# Patient Record
Sex: Female | Born: 1950
Health system: Southern US, Community
[De-identification: ages and names within clinical notes are randomized; demographics above are authoritative.]

## PROBLEM LIST (undated history)

## (undated) DIAGNOSIS — Q279 Congenital malformation of peripheral vascular system, unspecified: Secondary | ICD-10-CM

## (undated) DIAGNOSIS — Z862 Personal history of diseases of the blood and blood-forming organs and certain disorders involving the immune mechanism: Secondary | ICD-10-CM

## (undated) DIAGNOSIS — Z87898 Personal history of other specified conditions: Secondary | ICD-10-CM

## (undated) DIAGNOSIS — Z8719 Personal history of other diseases of the digestive system: Secondary | ICD-10-CM

## (undated) DIAGNOSIS — G252 Other specified forms of tremor: Secondary | ICD-10-CM

## (undated) HISTORY — PX: OTHER SURGICAL HISTORY: SHX169

## (undated) HISTORY — DX: Personal history of other diseases of the digestive system: Z87.19

## (undated) HISTORY — DX: Personal history of other specified conditions: Z87.898

## (undated) HISTORY — DX: Personal history of diseases of the blood and blood-forming organs and certain disorders involving the immune mechanism: Z86.2

## (undated) HISTORY — PX: CARDIOVASCULAR STRESS TEST: SHX262

## (undated) HISTORY — PX: ESOPHAGOGASTRODUODENOSCOPY: SHX1529

## (undated) HISTORY — DX: Other specified forms of tremor: G25.2

## (undated) HISTORY — DX: Congenital malformation of peripheral vascular system, unspecified: Q27.9

---

## 1998-07-06 ENCOUNTER — Ambulatory Visit (HOSPITAL_COMMUNITY): Admission: RE | Admit: 1998-07-06 | Discharge: 1998-07-06 | Payer: Self-pay | Admitting: Internal Medicine

## 1998-07-16 ENCOUNTER — Ambulatory Visit (HOSPITAL_COMMUNITY): Admission: RE | Admit: 1998-07-16 | Discharge: 1998-07-16 | Payer: Self-pay | Admitting: Family Medicine

## 2000-06-26 ENCOUNTER — Other Ambulatory Visit: Admission: RE | Admit: 2000-06-26 | Discharge: 2000-06-26 | Payer: Self-pay | Admitting: Family Medicine

## 2000-06-29 ENCOUNTER — Encounter: Admission: RE | Admit: 2000-06-29 | Discharge: 2000-06-29 | Payer: Self-pay | Admitting: Family Medicine

## 2000-06-29 ENCOUNTER — Encounter: Payer: Self-pay | Admitting: Family Medicine

## 2001-10-22 ENCOUNTER — Other Ambulatory Visit: Admission: RE | Admit: 2001-10-22 | Discharge: 2001-10-22 | Payer: Self-pay | Admitting: Family Medicine

## 2001-10-31 ENCOUNTER — Encounter: Payer: Self-pay | Admitting: Family Medicine

## 2001-10-31 ENCOUNTER — Encounter: Admission: RE | Admit: 2001-10-31 | Discharge: 2001-10-31 | Payer: Self-pay | Admitting: Family Medicine

## 2002-11-28 ENCOUNTER — Other Ambulatory Visit: Admission: RE | Admit: 2002-11-28 | Discharge: 2002-11-28 | Payer: Self-pay | Admitting: Family Medicine

## 2002-12-02 ENCOUNTER — Encounter: Payer: Self-pay | Admitting: Family Medicine

## 2002-12-02 ENCOUNTER — Encounter: Admission: RE | Admit: 2002-12-02 | Discharge: 2002-12-02 | Payer: Self-pay | Admitting: Family Medicine

## 2003-11-24 ENCOUNTER — Ambulatory Visit (HOSPITAL_COMMUNITY): Admission: RE | Admit: 2003-11-24 | Discharge: 2003-11-24 | Payer: Self-pay | Admitting: Gastroenterology

## 2003-12-04 ENCOUNTER — Ambulatory Visit (HOSPITAL_COMMUNITY): Admission: RE | Admit: 2003-12-04 | Discharge: 2003-12-04 | Payer: Self-pay | Admitting: Family Medicine

## 2003-12-13 HISTORY — PX: CARDIOVASCULAR STRESS TEST: SHX262

## 2004-02-02 ENCOUNTER — Other Ambulatory Visit: Admission: RE | Admit: 2004-02-02 | Discharge: 2004-02-02 | Payer: Self-pay | Admitting: Family Medicine

## 2004-12-07 ENCOUNTER — Ambulatory Visit (HOSPITAL_COMMUNITY): Admission: RE | Admit: 2004-12-07 | Discharge: 2004-12-07 | Payer: Self-pay | Admitting: Family Medicine

## 2004-12-22 ENCOUNTER — Ambulatory Visit: Payer: Self-pay | Admitting: Family Medicine

## 2005-07-05 ENCOUNTER — Ambulatory Visit: Payer: Self-pay | Admitting: Family Medicine

## 2005-07-05 ENCOUNTER — Other Ambulatory Visit: Admission: RE | Admit: 2005-07-05 | Discharge: 2005-07-05 | Payer: Self-pay | Admitting: Family Medicine

## 2005-07-14 ENCOUNTER — Ambulatory Visit: Payer: Self-pay | Admitting: Family Medicine

## 2006-07-06 ENCOUNTER — Encounter (INDEPENDENT_AMBULATORY_CARE_PROVIDER_SITE_OTHER): Payer: Self-pay | Admitting: Internal Medicine

## 2006-07-06 ENCOUNTER — Other Ambulatory Visit: Admission: RE | Admit: 2006-07-06 | Discharge: 2006-07-06 | Payer: Self-pay | Admitting: Family Medicine

## 2006-07-06 ENCOUNTER — Ambulatory Visit: Payer: Self-pay | Admitting: Family Medicine

## 2006-07-06 ENCOUNTER — Encounter (INDEPENDENT_AMBULATORY_CARE_PROVIDER_SITE_OTHER): Payer: Self-pay | Admitting: *Deleted

## 2006-12-12 HISTORY — PX: OTHER SURGICAL HISTORY: SHX169

## 2007-07-10 ENCOUNTER — Encounter (INDEPENDENT_AMBULATORY_CARE_PROVIDER_SITE_OTHER): Payer: Self-pay | Admitting: Internal Medicine

## 2007-07-10 DIAGNOSIS — D649 Anemia, unspecified: Secondary | ICD-10-CM | POA: Insufficient documentation

## 2007-07-16 ENCOUNTER — Encounter: Admission: RE | Admit: 2007-07-16 | Discharge: 2007-07-16 | Payer: Self-pay | Admitting: Family Medicine

## 2007-07-17 ENCOUNTER — Encounter (INDEPENDENT_AMBULATORY_CARE_PROVIDER_SITE_OTHER): Payer: Self-pay | Admitting: Internal Medicine

## 2007-07-17 ENCOUNTER — Ambulatory Visit: Payer: Self-pay | Admitting: Internal Medicine

## 2007-07-17 ENCOUNTER — Other Ambulatory Visit: Admission: RE | Admit: 2007-07-17 | Discharge: 2007-07-17 | Payer: Self-pay | Admitting: Internal Medicine

## 2007-07-17 DIAGNOSIS — G250 Essential tremor: Secondary | ICD-10-CM | POA: Insufficient documentation

## 2007-07-17 DIAGNOSIS — R1900 Intra-abdominal and pelvic swelling, mass and lump, unspecified site: Secondary | ICD-10-CM | POA: Insufficient documentation

## 2007-07-17 DIAGNOSIS — G252 Other specified forms of tremor: Secondary | ICD-10-CM

## 2007-07-18 ENCOUNTER — Encounter (INDEPENDENT_AMBULATORY_CARE_PROVIDER_SITE_OTHER): Payer: Self-pay | Admitting: Internal Medicine

## 2007-07-18 LAB — CONVERTED CEMR LAB
ALT: 24 units/L (ref 0–35)
Albumin: 3.7 g/dL (ref 3.5–5.2)
BUN: 10 mg/dL (ref 6–23)
Basophils Absolute: 0 10*3/uL (ref 0.0–0.1)
Basophils Relative: 0.8 % (ref 0.0–1.0)
Bilirubin, Direct: 0.2 mg/dL (ref 0.0–0.3)
Eosinophils Absolute: 0.5 10*3/uL (ref 0.0–0.6)
Eosinophils Relative: 8.7 % — ABNORMAL HIGH (ref 0.0–5.0)
GFR calc Af Amer: 133 mL/min
GFR calc non Af Amer: 110 mL/min
HDL: 48.4 mg/dL (ref >39.0)
LDL Cholesterol: 110 mg/dL — ABNORMAL HIGH (ref 0–99)
Lymphocytes Relative: 31.8 % (ref 12.0–46.0)
MCV: 94.8 fL (ref 78.0–100.0)
Monocytes Relative: 7.2 % (ref 3.0–11.0)
Neutrophils Relative %: 51.5 % (ref 43.0–77.0)
Platelets: 236 10*3/uL (ref 150–400)
Sodium: 139 meq/L (ref 135–145)
WBC: 5.3 10*3/uL (ref 4.5–10.5)

## 2007-07-27 ENCOUNTER — Encounter: Admission: RE | Admit: 2007-07-27 | Discharge: 2007-07-27 | Payer: Self-pay | Admitting: Family Medicine

## 2007-07-27 ENCOUNTER — Ambulatory Visit: Payer: Self-pay | Admitting: Family Medicine

## 2007-07-31 ENCOUNTER — Encounter (INDEPENDENT_AMBULATORY_CARE_PROVIDER_SITE_OTHER): Payer: Self-pay | Admitting: *Deleted

## 2007-08-20 ENCOUNTER — Encounter: Payer: Self-pay | Admitting: Family Medicine

## 2007-10-03 ENCOUNTER — Telehealth (INDEPENDENT_AMBULATORY_CARE_PROVIDER_SITE_OTHER): Payer: Self-pay | Admitting: Internal Medicine

## 2007-10-31 ENCOUNTER — Encounter: Payer: Self-pay | Admitting: Family Medicine

## 2008-04-18 ENCOUNTER — Encounter: Payer: Self-pay | Admitting: Family Medicine

## 2008-05-16 ENCOUNTER — Ambulatory Visit: Payer: Self-pay | Admitting: Family Medicine

## 2008-05-16 DIAGNOSIS — R252 Cramp and spasm: Secondary | ICD-10-CM | POA: Insufficient documentation

## 2008-06-03 ENCOUNTER — Encounter: Payer: Self-pay | Admitting: Family Medicine

## 2008-07-28 ENCOUNTER — Encounter: Admission: RE | Admit: 2008-07-28 | Discharge: 2008-07-28 | Payer: Self-pay | Admitting: Family Medicine

## 2008-10-07 ENCOUNTER — Encounter (INDEPENDENT_AMBULATORY_CARE_PROVIDER_SITE_OTHER): Payer: Self-pay | Admitting: Internal Medicine

## 2009-01-06 ENCOUNTER — Encounter (INDEPENDENT_AMBULATORY_CARE_PROVIDER_SITE_OTHER): Payer: Self-pay | Admitting: Internal Medicine

## 2009-03-10 ENCOUNTER — Encounter (INDEPENDENT_AMBULATORY_CARE_PROVIDER_SITE_OTHER): Payer: Self-pay | Admitting: Internal Medicine

## 2009-03-19 ENCOUNTER — Encounter (INDEPENDENT_AMBULATORY_CARE_PROVIDER_SITE_OTHER): Payer: Self-pay | Admitting: Internal Medicine

## 2009-03-19 ENCOUNTER — Other Ambulatory Visit: Admission: RE | Admit: 2009-03-19 | Discharge: 2009-03-19 | Payer: Self-pay | Admitting: Family Medicine

## 2009-03-19 ENCOUNTER — Ambulatory Visit: Payer: Self-pay | Admitting: Family Medicine

## 2009-03-19 DIAGNOSIS — R109 Unspecified abdominal pain: Secondary | ICD-10-CM | POA: Insufficient documentation

## 2009-03-19 DIAGNOSIS — N3945 Continuous leakage: Secondary | ICD-10-CM | POA: Insufficient documentation

## 2009-03-20 ENCOUNTER — Encounter (INDEPENDENT_AMBULATORY_CARE_PROVIDER_SITE_OTHER): Payer: Self-pay | Admitting: *Deleted

## 2009-03-20 LAB — CONVERTED CEMR LAB
ALT: 30 units/L (ref 0–35)
AST: 29 units/L (ref 0–37)
Albumin: 3.7 g/dL (ref 3.5–5.2)
Alkaline Phosphatase: 51 units/L (ref 39–117)
BUN: 14 mg/dL (ref 6–23)
Basophils Absolute: 0 10*3/uL (ref 0.0–0.1)
Bilirubin, Direct: 0.1 mg/dL (ref 0.0–0.3)
CO2: 30 meq/L (ref 19–32)
Calcium: 8.9 mg/dL (ref 8.4–10.5)
Creatinine, Ser: 0.7 mg/dL (ref 0.4–1.2)
Eosinophils Absolute: 0.1 10*3/uL (ref 0.0–0.7)
LDL Cholesterol: 119 mg/dL — ABNORMAL HIGH (ref 0–99)
MCV: 95.8 fL (ref 78.0–100.0)
Neutrophils Relative %: 47.3 % (ref 43.0–77.0)
Platelets: 200 10*3/uL (ref 150.0–400.0)
RDW: 12.7 % (ref 11.5–14.6)
TSH: 1.45 microintl units/mL (ref 0.35–5.50)
Triglycerides: 34 mg/dL (ref 0.0–149.0)
VLDL: 6.8 mg/dL (ref 0.0–40.0)

## 2009-04-06 ENCOUNTER — Ambulatory Visit: Payer: Self-pay | Admitting: Internal Medicine

## 2009-04-17 ENCOUNTER — Ambulatory Visit: Payer: Self-pay | Admitting: Internal Medicine

## 2009-04-17 HISTORY — PX: COLONOSCOPY: SHX174

## 2009-06-10 ENCOUNTER — Ambulatory Visit: Payer: Self-pay | Admitting: Family Medicine

## 2009-06-10 DIAGNOSIS — M719 Bursopathy, unspecified: Secondary | ICD-10-CM

## 2009-06-10 DIAGNOSIS — M7500 Adhesive capsulitis of unspecified shoulder: Secondary | ICD-10-CM | POA: Insufficient documentation

## 2009-06-10 DIAGNOSIS — M67919 Unspecified disorder of synovium and tendon, unspecified shoulder: Secondary | ICD-10-CM | POA: Insufficient documentation

## 2009-06-10 DIAGNOSIS — M25519 Pain in unspecified shoulder: Secondary | ICD-10-CM | POA: Insufficient documentation

## 2009-07-14 ENCOUNTER — Encounter (INDEPENDENT_AMBULATORY_CARE_PROVIDER_SITE_OTHER): Payer: Self-pay | Admitting: Internal Medicine

## 2009-07-20 ENCOUNTER — Ambulatory Visit: Payer: Self-pay | Admitting: Family Medicine

## 2009-12-31 ENCOUNTER — Encounter: Payer: Self-pay | Admitting: Family Medicine

## 2010-05-25 ENCOUNTER — Ambulatory Visit: Payer: Self-pay | Admitting: Family Medicine

## 2010-05-25 LAB — CONVERTED CEMR LAB
ALT: 20 units/L (ref 0–35)
AST: 20 units/L (ref 0–37)
Alkaline Phosphatase: 50 units/L (ref 39–117)
BUN: 16 mg/dL (ref 6–23)
Basophils Relative: 0.7 % (ref 0.0–3.0)
Calcium: 8.7 mg/dL (ref 8.4–10.5)
Eosinophils Relative: 2.7 % (ref 0.0–5.0)
GFR calc non Af Amer: 92.74 mL/min (ref >60)
Glucose, Bld: 85 mg/dL (ref 70–99)
Lymphs Abs: 1.5 10*3/uL (ref 0.7–4.0)
Monocytes Absolute: 0.3 10*3/uL (ref 0.1–1.0)
Monocytes Relative: 7.5 % (ref 3.0–12.0)
Neutrophils Relative %: 53.2 % (ref 43.0–77.0)
Platelets: 215 10*3/uL (ref 150.0–400.0)
RBC: 3.93 M/uL (ref 3.87–5.11)
TSH: 2.09 microintl units/mL (ref 0.35–5.50)
Triglycerides: 68 mg/dL (ref 0.0–149.0)
WBC: 4.1 10*3/uL — ABNORMAL LOW (ref 4.5–10.5)

## 2010-05-26 ENCOUNTER — Ambulatory Visit: Payer: Self-pay | Admitting: Family Medicine

## 2010-05-26 DIAGNOSIS — N950 Postmenopausal bleeding: Secondary | ICD-10-CM | POA: Insufficient documentation

## 2010-05-26 LAB — CONVERTED CEMR LAB: Vit D, 25-Hydroxy: 35 ng/mL (ref 30–89)

## 2010-05-27 ENCOUNTER — Encounter: Admission: RE | Admit: 2010-05-27 | Discharge: 2010-05-27 | Payer: Self-pay | Admitting: Family Medicine

## 2010-06-02 ENCOUNTER — Encounter (INDEPENDENT_AMBULATORY_CARE_PROVIDER_SITE_OTHER): Payer: Self-pay | Admitting: *Deleted

## 2010-09-16 ENCOUNTER — Ambulatory Visit: Payer: Self-pay | Admitting: Internal Medicine

## 2010-09-16 DIAGNOSIS — R21 Rash and other nonspecific skin eruption: Secondary | ICD-10-CM | POA: Insufficient documentation

## 2010-11-29 ENCOUNTER — Encounter: Payer: Self-pay | Admitting: Family Medicine

## 2011-01-02 ENCOUNTER — Encounter: Payer: Self-pay | Admitting: Family Medicine

## 2011-01-11 NOTE — Letter (Signed)
Summary: Results Follow up Letter  [place] [facility]  [address] [place], [place] [address]   Phone: [phone]  Fax: [phone]    06/02/2010 MRN: [id number]   Elizabeth Duncan [address] [place], [place]  [address]    Dear Elizabeth Duncan,  The following are the results of your recent test(s):  Test         Result    Pap Smear:        Normal _____  Not Normal _____ Comments: ______________________________________________________ Cholesterol: LDL(Bad cholesterol):         Your goal is less than:         HDL (Good cholesterol):       Your goal is more than: Comments:  ______________________________________________________ Mammogram:        Normal __X___  Not Normal _____ Comments:  Yearly follow up is recommended.   ___________________________________________________________________ Hemoccult:        Normal _____  Not normal _______ Comments:    _____________________________________________________________________ Other Tests:    We routinely do not discuss normal results over the telephone.  If you desire a copy of the results, or you have any questions about this information we can discuss them at your next office visit.   Sincerely,    Marne A. Milinda Antis, M.D.  MAT:lsf

## 2011-01-11 NOTE — Assessment & Plan Note (Signed)
Summary: Elizabeth Duncan   Vital Signs:  Patient profile:   60 year old female Height:      65.5 inches Weight:      160.25 pounds BMI:     26.36 Temp:     97.8 degrees F oral Pulse rate:   72 / minute Pulse rhythm:   regular BP sitting:   116 / 70  (left arm) Cuff size:   regular  Vitals Entered By: Lewanda Rife LPN (May 26, 2010 9:22 AM) CC: CPX LMP 03/18/10   History of Present Illness: here for health mt exam   is feeling good and doing well   wt is down 1 lb  bp good 116/70  hx of anemia- nl cbc this check   pap 4/10-- is due for that  hx of endomet -- had? d and c for polyps  is having a little prolapse - has disc this with Dr Rosemary Holms -- does not bother her that much  has little growths on outside on vagina -- no change in those  had a period in april and one last sept (in past heavy and long over 14 days) hot flashes are better    mam 8/09-- ? if she got one in 2010  not noticed any lumps   is lifting weights and doing aerobic exercise  is eating healthy except occ sweets  stays physically and mentally active   Tdap 08  colonosc 5/10- 10 y f/u recommended suspects she may have pinworms -- anal itching early in am  has grandkids who share food and suck their thumbs  has not seen worms in stool -- but has had this before    recent lipids good with trig 68 and HDL 58 and LDL 110   other labs nl   vit D level 35   Allergies: 1)  ! Amoxicillin  Past History:  Past Medical History: diverticulosis urine incontinence hx of pelvic mass intention tremor  anemia   Review of Systems General:  Denies fatigue and malaise. Eyes:  Denies blurring and eye pain. CV:  Denies chest pain or discomfort, palpitations, and shortness of breath with exertion. Resp:  Denies cough and wheezing. GI:  Denies abdominal pain. GU:  Denies discharge and dysuria. MS:  Denies joint pain, cramps, and muscle weakness. Derm:  Denies itching, lesion(s), poor wound  healing, and rash. Neuro:  Denies headaches, numbness, and tingling. Psych:  Denies anxiety and depression. Endo:  Denies cold intolerance and heat intolerance. Heme:  Denies abnormal bruising and bleeding.  Physical Exam  General:  Well-developed,well-nourished,in no acute distress; alert,appropriate and cooperative throughout examination Head:  normocephalic, atraumatic, and no abnormalities observed.   Eyes:  vision grossly intact, pupils equal, pupils round, and pupils reactive to light.  no conjunctival pallor, injection or icterus  Ears:  R ear normal and L ear normal.   Nose:  no nasal discharge.   Mouth:  pharynx pink and moist.   Neck:  supple with full rom and no masses or thyromegally, no JVD or carotid bruit  Chest Wall:  No deformities, masses, or tenderness noted. Lungs:  Normal respiratory effort, chest expands symmetrically. Lungs are clear to auscultation, no crackles or wheezes. Heart:  Normal rate and regular rhythm. S1 and S2 normal without gallop, murmur, click, rub or other extra sounds. Abdomen:  Bowel sounds positive,abdomen soft and non-tender without masses, organomegaly or hernias noted. Msk:  No deformity or scoliosis noted of thoracic or lumbar spine.  no acute joint  changes  Pulses:  R and L carotid,radial,femoral,dorsalis pedis and posterior tibial pulses are full and equal bilaterally Extremities:  No clubbing, cyanosis, edema, or deformity noted with normal full range of motion of all joints.   Neurologic:  sensation intact to light touch, gait normal, and DTRs symmetrical and normal.   Skin:  Intact without suspicious lesions or rashes Cervical Nodes:  No lymphadenopathy noted Inguinal Nodes:  No significant adenopathy Psych:  normal affect, talkative and pleasant    Impression & Recommendations:  Problem # 1:  WELL ADULT EXAM (ICD-V70.0) Assessment Comment Only reviewed health habits including diet, exercise and skin cancer prevention reviewed  health maintenance list and family history commended on good health habits  rev labs in detail rec starting ca and vit d  Problem # 2:  POSTMENOPAUSAL BLEEDING (ICD-627.1) at age 89 with remote hx of endometrial ? polyp  ref gyn - also for annual exam  Orders: Gynecologic Referral (Gyn)  Problem # 3:  OTHER SCREENING MAMMOGRAM (ICD-V76.12) sched mam  Orders: Radiology Referral (Radiology)  Patient Instructions: 1)  the current recommendation for calcium intake is 1200-1500 mg daily with 1000 IU of vitamin D  2)  keep exercising  3)  keep up the great work taking care of yourself  4)  we will do gyn ref at check out   Prior Medications (reviewed today): None Current Allergies (reviewed today): ! AMOXICILLIN

## 2011-01-11 NOTE — Letter (Signed)
Summary: [facility]  [facility]   Imported By: Lanelle Bal 01/14/2010 08:43:21  _____________________________________________________________________  External Attachment:    Type:   Image     Comment:   External Document

## 2011-01-11 NOTE — Assessment & Plan Note (Signed)
Summary: ring worm/alc   Vital Signs:  Patient profile:   60 year old female Weight:      159.25 pounds Temp:     98.2 degrees F oral Pulse rate:   76 / minute Pulse rhythm:   regular BP sitting:   116 / 70  (left arm) Cuff size:   regular  Vitals Entered By: Selena Batten Dance CMA Duncan Dull) (September 16, 2010 2:01 PM) CC: ? ringworm   History of Present Illness: CC: ?ringworm  Spot L anterior leg.  Using tinactin x 3 months.  Initially responded to.  Then a week later flared up again.  Started after took walk in woods.  No tick or bugs.  No scratches or abrasion.  Also tried calamine  No other rashes, oral lesions, f/c/abd pain, n/v, joint pain, muscle pains, HA.  Son with h/o eczema and psoriasis.  Pt without h/o eczema, asthma, allergies.  No smokers at home.  Current Medications (verified): 1)  Calcium-Vitamin D 600-200 Mg-Unit Tabs (Calcium-Vitamin D) .Marland Kitchen.. 1 By Mouth Two Times A Day 2)  Triamcinolone Acetonide 0.1 % Crea (Triamcinolone Acetonide) .... One Two Times A Day X 2 Wks  Allergies: 1)  ! Amoxicillin  Past History:  Past Medical History: Last updated: 05/26/2010 diverticulosis urine incontinence hx of pelvic mass intention tremor  anemia   Past Surgical History: Last updated: 04/19/2009 Laser treatment- birth mark Vag & pelvic U/S thickening endometr--Dr. Billy Coast 07/01(3 polp) Stress testing Birth mark-tx a [facility] right leg  Abd/pelvic CT, negative (incidental renal cyst) 09/04 EGD- gastritis 12/04 Bone spur R 5th toe removed--2008 5/08 colonoscopy- mild diverticulosis - rec f/u in 5 y  Social History: Marital Status: Married Children: 4 Occupation: Runner, broadcasting/film/video, english  Review of Systems       per HPI  Physical Exam  General:  Well-developed,well-nourished,in no acute distress; alert,appropriate and cooperative throughout examination Skin:  annular confluence of erythematous pruritic papules.  no scale.  no central clearing.  s/p antifungal last night,  lotion this am.   Impression & Recommendations:  Problem # 1:  SKIN RASH (ICD-782.1) unclear etiology, complicated with recent prolonged antifungal use.  given very pruritic, treat with steroid cream.  eczema vs allergic.  (duration points agains allergic, but pt without h/o eczema in past).  if not improving, would switch antifungal and try with this.  if not improving, would recommend off all meds for 1-2 wks then biopsy.  Her updated medication list for this problem includes:    Triamcinolone Acetonide 0.1 % Crea (Triamcinolone acetonide) ..... One two times a day x 2 wks  Complete Medication List: 1)  Calcium-vitamin D 600-200 Mg-unit Tabs (Calcium-vitamin d) .Marland Kitchen.. 1 by mouth two times a day 2)  Triamcinolone Acetonide 0.1 % Crea (Triamcinolone acetonide) .... One two times a day x 2 wks  Other Orders: Flu Vaccine 87yrs + (87564) Admin 1st Vaccine (33295)  Patient Instructions: 1)  Could be eczema - treat with triamcinolone two times a day x 2 weeks 2)  Call me with update. Prescriptions: TRIAMCINOLONE ACETONIDE 0.1 % CREA (TRIAMCINOLONE ACETONIDE) one two times a day x 2 wks  #1 x 0   Entered and Authorized by:   Eustaquio Boyden  MD   Signed by:   Eustaquio Boyden  MD on 09/16/2010   Method used:   Electronically to        [facility] (retail)       [address], [address]  [place], [place]  [address]       Ph: [phone]       Fax: [phone]   RxID:   [id number]   Current Allergies (reviewed today): ! AMOXICILLIN   Immunizations Administered:  Influenza Vaccine # 1:    Vaccine Type: Fluvax 3+    Site: left deltoid    Mfr: GlaxoSmithKline    Dose: 0.5 ml    Route: IM    Given by: Janee Morn CMA (AAMA)    Exp. Date: 06/11/2011    Lot #: [id number]    VIS given: 07/06/10 version given September 16, 2010.  Flu Vaccine Consent Questions:    Do you have a history of severe allergic reactions to this vaccine? no     Any prior history of allergic reactions to egg and/or gelatin? no    Do you have a sensitivity to the preservative Thimersol? no    Do you have a past history of Guillan-Barre Syndrome? no    Do you currently have an acute febrile illness? no    Have you ever had a severe reaction to latex? no    Vaccine information given and explained to patient? yes    Are you currently pregnant? no

## 2011-01-11 NOTE — Letter (Signed)
Summary: Results Follow up Letter  [facility] [place]  [address]   [place], [place] [address]   Phone: [phone]  Fax: [phone]    06/02/2010 MRN: [id number]   Elizabeth Duncan [address] [place], [place]  [address]    Dear Ms. Seto,  The following are the results of your recent test(s):  Test         Result    Pap Smear:        Normal _____  Not Normal _____ Comments: ______________________________________________________ Cholesterol: LDL(Bad cholesterol):         Your goal is less than:         HDL (Good cholesterol):       Your goal is more than: Comments:  ______________________________________________________ Mammogram:        Normal __X___  Not Normal _____ Comments:  Yearly follow up is recommended.   ___________________________________________________________________ Hemoccult:        Normal _____  Not normal _______ Comments:    _____________________________________________________________________ Other Tests:    We routinely do not discuss normal results over the telephone.  If you desire a copy of the results, or you have any questions about this information we can discuss them at your next office visit.   Sincerely,    Marne A. Tower, M.D.  MAT:lsf  

## 2011-01-13 NOTE — Letter (Signed)
Summary: Dr.Claude Burton,[facility],Note  Dr.Claude Burton,[facility],Note   Imported By: Beau Fanny 12/16/2010 16:53:40  _____________________________________________________________________  External Attachment:    Type:   Image     Comment:   External Document

## 2011-05-04 ENCOUNTER — Encounter: Payer: Self-pay | Admitting: Family Medicine

## 2011-07-21 ENCOUNTER — Other Ambulatory Visit: Payer: Self-pay | Admitting: Family Medicine

## 2011-07-21 DIAGNOSIS — Z1231 Encounter for screening mammogram for malignant neoplasm of breast: Secondary | ICD-10-CM

## 2011-07-27 ENCOUNTER — Ambulatory Visit (HOSPITAL_COMMUNITY)
Admission: RE | Admit: 2011-07-27 | Discharge: 2011-07-27 | Disposition: A | Discharge status: Home / Self Care | Payer: BC Managed Care – PPO | Source: Ambulatory Visit | Attending: Family Medicine | Admitting: Family Medicine

## 2011-07-27 DIAGNOSIS — Z1231 Encounter for screening mammogram for malignant neoplasm of breast: Secondary | ICD-10-CM

## 2011-08-01 ENCOUNTER — Encounter: Payer: Self-pay | Admitting: *Deleted

## 2011-10-18 ENCOUNTER — Encounter: Payer: Self-pay | Admitting: Family Medicine

## 2012-05-11 ENCOUNTER — Ambulatory Visit (INDEPENDENT_AMBULATORY_CARE_PROVIDER_SITE_OTHER): Payer: BC Managed Care – PPO | Admitting: Family Medicine

## 2012-05-11 ENCOUNTER — Encounter: Payer: Self-pay | Admitting: Family Medicine

## 2012-05-11 VITALS — BP 120/70 | HR 89 | Temp 98.0°F | Ht 66.0 in | Wt 157.2 lb

## 2012-05-11 DIAGNOSIS — R42 Dizziness and giddiness: Secondary | ICD-10-CM

## 2012-05-11 MED ORDER — FLUTICASONE PROPIONATE 50 MCG/ACT NA SUSP: 2.0000 | Freq: Every day | NASAL | Status: DC

## 2012-05-11 NOTE — Assessment & Plan Note (Signed)
By exam and hx suspect mild BPV Could be allergy related Will try flonase  Episodes are too brief for meclizine If not imp will consider ENT / PT /exercises  Will call if not better in 2 weeks or if worse or other symptoms Re assuring neuro exam

## 2012-05-11 NOTE — Progress Notes (Signed)
Subjective:    Patient ID: Elizabeth Duncan, female    DOB: 12-12-1951, 61 y.o.   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 ANEMIA  . INTENTION TREMOR  . POSTMENOPAUSAL BLEEDING  . SHOULDER PAIN, RIGHT  . ADHESIVE CAPSULITIS, RIGHT  . ROTATOR CUFF SYNDROME, RIGHT  . MUSCLE CRAMPS  . SKIN RASH  . LEAKAGE, CONTINUOUS URINE  . ABDOMINAL PAIN OTHER SPECIFIED SITE  . PELVIC MASS   Past Medical History  Diagnosis Date  . Congenital vascular malformation     hemangiomas being treated with laser surgery by Marvene Staff (Dr. Azucena Cecil)   No past surgical history on file. History  Substance Use Topics  . Smoking status: Passive Smoker  . Smokeless tobacco: Not on file  . Alcohol Use: Not on file   No family history on file. Allergies  Allergen Reactions  . Amoxicillin     REACTION: rash   No current outpatient prescriptions on file prior to visit.     Review of Systems Review of Systems  Constitutional: Negative for fever, appetite change, fatigue and unexpected weight change.  Eyes: Negative for pain and visual disturbance.  ENT no st or ha or sinus pain  Respiratory: Negative for cough and shortness of breath.   Cardiovascular: Negative for cp or palpitations    Gastrointestinal: Negative for nausea, diarrhea and constipation.  Genitourinary: Negative for urgency and frequency.  Skin: Negative for pallor or rash   Neurological: Negative for weakness, numbness and headaches. pos for  dizziness that is positional Hematological: Negative for adenopathy. Does not bruise/bleed easily.  Psychiatric/Behavioral: Negative for dysphoric mood. The patient is not nervous/anxious.          Objective:   Physical Exam  Constitutional: She appears well-developed and well-nourished.  HENT:  Head: Normocephalic and atraumatic.  Right Ear: External ear normal.  Left Ear: External ear normal.  Nose: Nose normal.  Mouth/Throat: Oropharynx is clear and moist.  Eyes: Conjunctivae and EOM are normal. Pupils are equal, round, and reactive to light. No scleral icterus.       3-4 beats of horizontal nystagmus bilaterally with reprod of dizziness   No sinus tenderness  Neck: Normal range of motion. Neck supple. No JVD present. No thyromegaly present.  Cardiovascular: Normal rate, regular rhythm, normal heart sounds and intact distal pulses.   Pulmonary/Chest: Effort normal and breath sounds normal. No respiratory distress. She has no wheezes.  Musculoskeletal: She exhibits no edema.  Lymphadenopathy:    She has no cervical adenopathy.  Neurological: She is alert. She has normal strength and normal reflexes. She is not disoriented. She displays no atrophy and no tremor. No cranial nerve deficit or sensory deficit. She exhibits normal muscle tone. She displays a negative Romberg sign. She displays no seizure activity. Coordination and gait normal.       Nystagmus noted  Nl gait and rhomberg No  focal cerebellar signs  Her dizziness is positional (head)          Assessment & Plan:

## 2012-05-11 NOTE — Patient Instructions (Addendum)
Try the flonase for 2 weeks to keep eustacian tubes clear Update me if dizziness does not stop or if it worsens or you develop other symptoms

## 2012-05-17 DIAGNOSIS — D1800 Hemangioma unspecified site: Secondary | ICD-10-CM | POA: Insufficient documentation

## 2012-08-23 ENCOUNTER — Other Ambulatory Visit: Payer: Self-pay | Admitting: Family Medicine

## 2012-08-23 DIAGNOSIS — Z1231 Encounter for screening mammogram for malignant neoplasm of breast: Secondary | ICD-10-CM

## 2012-09-05 ENCOUNTER — Ambulatory Visit (HOSPITAL_COMMUNITY)
Admission: RE | Admit: 2012-09-05 | Discharge: 2012-09-05 | Disposition: A | Discharge status: Home / Self Care | Payer: BC Managed Care – PPO | Source: Ambulatory Visit | Attending: Family Medicine | Admitting: Family Medicine

## 2012-09-05 DIAGNOSIS — Z1231 Encounter for screening mammogram for malignant neoplasm of breast: Secondary | ICD-10-CM

## 2012-09-06 ENCOUNTER — Encounter: Payer: Self-pay | Admitting: *Deleted

## 2013-01-21 ENCOUNTER — Ambulatory Visit (INDEPENDENT_AMBULATORY_CARE_PROVIDER_SITE_OTHER): Payer: BC Managed Care – PPO | Admitting: Family Medicine

## 2013-01-21 ENCOUNTER — Encounter: Payer: Self-pay | Admitting: Family Medicine

## 2013-01-21 VITALS — BP 134/88 | HR 69 | Temp 98.0°F | Ht 65.5 in | Wt 154.5 lb

## 2013-01-21 DIAGNOSIS — N393 Stress incontinence (female) (male): Secondary | ICD-10-CM

## 2013-01-21 DIAGNOSIS — Z0000 Encounter for general adult medical examination without abnormal findings: Secondary | ICD-10-CM

## 2013-01-21 LAB — COMPREHENSIVE METABOLIC PANEL
ALT: 22 U/L (ref 0–35)
AST: 25 U/L (ref 0–37)
Alkaline Phosphatase: 54 U/L (ref 39–117)
Calcium: 8.6 mg/dL (ref 8.4–10.5)
Glucose, Bld: 83 mg/dL (ref 70–99)
Sodium: 141 mEq/L (ref 135–145)
Total Bilirubin: 0.7 mg/dL (ref 0.3–1.2)
Total Protein: 6.9 g/dL (ref 6.0–8.3)

## 2013-01-21 LAB — CBC WITH DIFFERENTIAL/PLATELET
Lymphs Abs: 1.5 10*3/uL (ref 0.7–4.0)
MCHC: 33.8 g/dL (ref 30.0–36.0)
Monocytes Absolute: 0.3 10*3/uL (ref 0.1–1.0)
Neutrophils Relative %: 64 % (ref 43.0–77.0)

## 2013-01-21 LAB — LIPID PANEL
Cholesterol: 177 mg/dL (ref 0–200)
LDL Cholesterol: 103 mg/dL — ABNORMAL HIGH (ref 0–99)
Total CHOL/HDL Ratio: 3
Triglycerides: 68 mg/dL (ref 0.0–149.0)
VLDL: 13.6 mg/dL (ref 0.0–40.0)

## 2013-01-21 NOTE — Assessment & Plan Note (Signed)
Reviewed health habits including diet and exercise and skin cancer prevention Also reviewed health mt list, fam hx and immunizations  Labs today  Enc to keep wt lifting Will consider zostavax-check with ins  utd gyn care

## 2013-01-21 NOTE — Assessment & Plan Note (Signed)
Ref to urol for further eval

## 2013-01-21 NOTE — Progress Notes (Signed)
Subjective:    Patient ID: Elizabeth Duncan, female    DOB: [birth year], 62 y.o.   MRN: [id number]  HPI Here for health maintenance exam and to review chronic medical problems    Is doing great overall  No new problems   Wt is down 3 lb with bmi of 25  mammo 9/13 - was ok  gmother had breast cancer  Self exam-nothing new -no lumps   Pap last year - up to date - in 2013  Dr Rosemary Holms   Is having incontinence problems- and asks for referral for urology  Only stress incontinence - with laughter and cough/ sneeze   Colon cancer screen- had a colonoscopy - about 4 years ago - has diverticulosis (has to watch nut and seed intake) Recall is 10 years   Flu vaccine-- had it in oct   Zoster status- is interested in that  Will check with her insurance coverage   Needs labs - wellness panel   Has questions about vit E, vit D and fish oil   She plans to start daily D   Mother passed away from alzheimers   Patient Active Problem List  Diagnosis  . ANEMIA  . INTENTION TREMOR  . POSTMENOPAUSAL BLEEDING  . SHOULDER PAIN, RIGHT  . ADHESIVE CAPSULITIS, RIGHT  . ROTATOR CUFF SYNDROME, RIGHT  . MUSCLE CRAMPS  . SKIN RASH  . LEAKAGE, CONTINUOUS URINE  . ABDOMINAL PAIN OTHER SPECIFIED SITE  . PELVIC MASS  . Dizziness  . Routine general medical examination at a health care facility   Past Medical History  Diagnosis Date  . Congenital vascular malformation     hemangiomas being treated with laser surgery by Marvene Staff (Dr. Azucena Cecil)  . History of diverticulosis   . History of urinary incontinence   . History of pelvic mass   . Intention tremor   . History of anemia    Past Surgical History  Procedure Laterality Date  . Laser treatment of birth mark      @ [facility] (right leg)  . Cardiovascular stress test    . Esophagogastroduodenoscopy      gastritis  . Bone spur  2008    Rt 5th toe removed   History  Substance Use Topics  . Smoking status: Never Smoker   . Smokeless tobacco:  Not on file  . Alcohol Use: No   Family History  Problem Relation Age of Onset  . Stroke Father     x2  . Heart failure Father   . Alzheimer's disease Mother   . Kidney Stones Brother   . Diabetes Brother   . Bipolar disorder Sister   . Cancer Maternal Grandmother     breast cancer   Allergies  Allergen Reactions  . Amoxicillin     REACTION: rash   No current outpatient prescriptions on file prior to visit.   No current facility-administered medications on file prior to visit.         Review of Systems Review of Systems  Constitutional: Negative for fever, appetite change, fatigue and unexpected weight change.  Eyes: Negative for pain and visual disturbance.  Respiratory: Negative for cough and shortness of breath.   Cardiovascular: Negative for cp or palpitations    Gastrointestinal: Negative for nausea, diarrhea and constipation.  Genitourinary: Negative for urgency and frequency. pos for urine incontinence  Skin: Negative for pallor or rash   Neurological: Negative for weakness, light-headedness, numbness and headaches.  Hematological: Negative for adenopathy. Does not bruise/bleed  easily.  Psychiatric/Behavioral: Negative for dysphoric mood. The patient is not nervous/anxious.         Objective:   Physical Exam  Constitutional: She appears well-developed and well-nourished.  HENT:  Head: Normocephalic and atraumatic.  Right Ear: External ear normal.  Left Ear: External ear normal.  Nose: Nose normal.  Mouth/Throat: Oropharynx is clear and moist. No oropharyngeal exudate.  Eyes: Conjunctivae and EOM are normal. Pupils are equal, round, and reactive to light. Right eye exhibits no discharge. Left eye exhibits no discharge. No scleral icterus.  Neck: Normal range of motion. Neck supple. No JVD present. Carotid bruit is not present. No thyromegaly present.  Cardiovascular: Normal rate, regular rhythm, normal heart sounds and intact distal pulses.  Exam reveals  no gallop.   Pulmonary/Chest: Breath sounds normal. No respiratory distress. She has no wheezes. She exhibits no tenderness.  Abdominal: Soft. Bowel sounds are normal. She exhibits no distension, no abdominal bruit and no mass. There is no tenderness.  Musculoskeletal: She exhibits no edema and no tenderness.  Lymphadenopathy:    She has no cervical adenopathy.  Neurological: She is alert. She has normal reflexes. No cranial nerve deficit. She exhibits normal muscle tone. Coordination normal.  Skin: Skin is warm and dry. No rash noted. No erythema. No pallor.  Psychiatric: She has a normal mood and affect.          Assessment & Plan:

## 2013-01-21 NOTE — Patient Instructions (Addendum)
Labs today  If you are interested in a shingles/zoster vaccine - call your insurance to check on coverage,( you should not get it within 1 month of other vaccines) , then call us for a prescription  for it to take to a pharmacy that gives the shot , or make a nurse visit to get it here depending on your coverage We will do urology referral at check out  Take vitamin D 1000 iu daily over the counter

## 2013-01-22 ENCOUNTER — Encounter: Payer: Self-pay | Admitting: *Deleted

## 2013-01-22 ENCOUNTER — Encounter: Payer: BC Managed Care – PPO | Admitting: Family Medicine

## 2013-08-28 ENCOUNTER — Ambulatory Visit (INDEPENDENT_AMBULATORY_CARE_PROVIDER_SITE_OTHER): Payer: BC Managed Care – PPO | Admitting: Family Medicine

## 2013-08-28 ENCOUNTER — Encounter: Payer: Self-pay | Admitting: Family Medicine

## 2013-08-28 VITALS — BP 124/86 | HR 72 | Temp 98.3°F | Ht 65.5 in | Wt 157.8 lb

## 2013-08-28 DIAGNOSIS — W57XXXA Bitten or stung by nonvenomous insect and other nonvenomous arthropods, initial encounter: Secondary | ICD-10-CM | POA: Insufficient documentation

## 2013-08-28 DIAGNOSIS — S70269A Insect bite (nonvenomous), unspecified hip, initial encounter: Secondary | ICD-10-CM | POA: Insufficient documentation

## 2013-08-28 DIAGNOSIS — T148XXS Other injury of unspecified body region, sequela: Secondary | ICD-10-CM

## 2013-08-28 DIAGNOSIS — S70262S Insect bite (nonvenomous), left hip, sequela: Secondary | ICD-10-CM

## 2013-08-28 DIAGNOSIS — M2550 Pain in unspecified joint: Secondary | ICD-10-CM

## 2013-08-28 DIAGNOSIS — IMO0002 Reserved for concepts with insufficient information to code with codable children: Secondary | ICD-10-CM

## 2013-08-28 MED ORDER — DOXYCYCLINE HYCLATE 100 MG PO TABS: 100.0000 mg | ORAL_TABLET | Freq: Two times a day (BID) | ORAL | Status: DC

## 2013-08-28 NOTE — Patient Instructions (Signed)
Lab today Take the doxycyline as directed Keep the bite site clean and use antibiotic ointment  Will update when labs return

## 2013-08-28 NOTE — Progress Notes (Signed)
Subjective:    Patient ID: Elizabeth Duncan, female    DOB: 1951-12-04, 62 y.o.   MRN: [id number]  HPI Here for tick bite  She got the tick off (L hip)- did not get the mouth parts  Really dug into it     Feet and hands feel swollen  Her knee and elbow joints hurt- very unusual -worse on the L side No rash No fever   No redness from bite  No bullseye shape   Patient Active Problem List   Diagnosis Date Noted  . Routine general medical examination at a health care facility 01/21/2013  . Stress incontinence in female 01/21/2013  . INTENTION TREMOR 07/17/2007  . ANEMIA 07/10/2007   Past Medical History  Diagnosis Date  . Congenital vascular malformation     hemangiomas being treated with laser surgery by [facility] (Dr. Azucena Cecil)  . History of diverticulosis   . History of urinary incontinence   . History of pelvic mass   . Intention tremor   . History of anemia    Past Surgical History  Procedure Laterality Date  . Laser treatment of birth mark      @ [facility] (right leg)  . Cardiovascular stress test    . Esophagogastroduodenoscopy      gastritis  . Bone spur  2008    Rt 5th toe removed   History  Substance Use Topics  . Smoking status: Never Smoker   . Smokeless tobacco: Not on file  . Alcohol Use: No   Family History  Problem Relation Age of Onset  . Stroke Father     x2  . Heart failure Father   . Alzheimer's disease Mother   . Kidney Stones Brother   . Diabetes Brother   . Bipolar disorder Sister   . Cancer Maternal Grandmother     breast cancer   Allergies  Allergen Reactions  . Amoxicillin     REACTION: rash   No current outpatient prescriptions on file prior to visit.   No current facility-administered medications on file prior to visit.     Review of Systems Review of Systems  Constitutional: Negative for fever, appetite change, fatigue and unexpected weight change.  Eyes: Negative for pain and visual disturbance.  Respiratory: Negative for  cough and shortness of breath.   Cardiovascular: Negative for cp or palpitations    Gastrointestinal: Negative for nausea, diarrhea and constipation.  Genitourinary: Negative for urgency and frequency.  Skin: Negative for pallor or rash   MSK pos for joint pain and stiffness without any swelling or redness Neurological: Negative for weakness, light-headedness, numbness and headaches.  Hematological: Negative for adenopathy. Does not bruise/bleed easily.  Psychiatric/Behavioral: Negative for dysphoric mood. The patient is not nervous/anxious.         Objective:   Physical Exam  Constitutional: She appears well-developed and well-nourished. No distress.  HENT:  Head: Normocephalic and atraumatic.  Mouth/Throat: Oropharynx is clear and moist.  Eyes: Conjunctivae and EOM are normal. Pupils are equal, round, and reactive to light. Right eye exhibits no discharge. Left eye exhibits no discharge. No scleral icterus.  Neck: Normal range of motion. Neck supple. No JVD present. No thyromegaly present.  Cardiovascular: Normal rate and regular rhythm.   Pulmonary/Chest: Effort normal and breath sounds normal.  Abdominal: Soft. Bowel sounds are normal.  Musculoskeletal: She exhibits tenderness. She exhibits no edema.  Mild tenderness of elbows and distal phalanges without swelling or redness  Lymphadenopathy:    She has  no cervical adenopathy.  Neurological: She is alert. She has normal reflexes. No cranial nerve deficit. She exhibits normal muscle tone. Coordination normal.  Skin: Skin is warm and dry. No rash noted. No erythema. No pallor.  L hip - small scab- cleaned and unroofed-no tick parts left No redness/ rash or bullseye markings  Pt showed me tick in bag- is too large to be a deer tick  Psychiatric: She has a normal mood and affect.          Assessment & Plan:

## 2013-08-29 LAB — ROCKY MTN SPOTTED FVR ABS PNL(IGG+IGM)
RMSF IgG: 0.28 IV
RMSF IgM: 0.21 IV

## 2013-08-29 NOTE — Assessment & Plan Note (Signed)
No rash and tick was fully removed Disc wound care  In light of joint pain - tick labs run and tx with doxy Update if not starting to improve in a week or if worsening

## 2013-08-29 NOTE — Assessment & Plan Note (Signed)
Without swelling or signs of synovitis Tick labs run  tx with doxy Update if not starting to improve in a week or if worsening

## 2013-08-30 ENCOUNTER — Other Ambulatory Visit: Payer: Self-pay | Admitting: Family Medicine

## 2013-08-30 DIAGNOSIS — Z1231 Encounter for screening mammogram for malignant neoplasm of breast: Secondary | ICD-10-CM

## 2013-09-10 ENCOUNTER — Ambulatory Visit (HOSPITAL_COMMUNITY): Payer: BC Managed Care – PPO

## 2013-09-17 ENCOUNTER — Ambulatory Visit (HOSPITAL_COMMUNITY): Payer: BC Managed Care – PPO

## 2013-10-08 ENCOUNTER — Ambulatory Visit (HOSPITAL_COMMUNITY)
Admission: RE | Admit: 2013-10-08 | Discharge: 2013-10-08 | Disposition: A | Discharge status: Home / Self Care | Payer: BC Managed Care – PPO | Source: Ambulatory Visit | Attending: Family Medicine | Admitting: Family Medicine

## 2013-10-08 DIAGNOSIS — Z1231 Encounter for screening mammogram for malignant neoplasm of breast: Secondary | ICD-10-CM | POA: Insufficient documentation

## 2013-10-10 ENCOUNTER — Encounter: Payer: Self-pay | Admitting: *Deleted

## 2013-11-19 DIAGNOSIS — Q825 Congenital non-neoplastic nevus: Secondary | ICD-10-CM | POA: Insufficient documentation

## 2013-11-19 DIAGNOSIS — L570 Actinic keratosis: Secondary | ICD-10-CM | POA: Insufficient documentation

## 2013-12-27 ENCOUNTER — Ambulatory Visit: Payer: Self-pay | Admitting: Podiatry

## 2014-08-26 ENCOUNTER — Ambulatory Visit (INDEPENDENT_AMBULATORY_CARE_PROVIDER_SITE_OTHER): Payer: BC Managed Care – PPO | Admitting: Family Medicine

## 2014-08-26 ENCOUNTER — Encounter (INDEPENDENT_AMBULATORY_CARE_PROVIDER_SITE_OTHER): Payer: Self-pay

## 2014-08-26 ENCOUNTER — Encounter: Payer: Self-pay | Admitting: Family Medicine

## 2014-08-26 VITALS — BP 138/78 | HR 62 | Temp 98.0°F | Ht 65.25 in | Wt 152.2 lb

## 2014-08-26 DIAGNOSIS — Z23 Encounter for immunization: Secondary | ICD-10-CM

## 2014-08-26 DIAGNOSIS — Z0000 Encounter for general adult medical examination without abnormal findings: Secondary | ICD-10-CM

## 2014-08-26 NOTE — Progress Notes (Signed)
Pre visit review using our clinic review tool, if applicable. No additional management support is needed unless otherwise documented below in the visit note. 

## 2014-08-26 NOTE — Progress Notes (Signed)
Subjective:    Patient ID: Elizabeth Duncan, female    DOB: [birth year], 63 y.o.   MRN: [id number]  HPI Here for health maintenance exam and to review chronic medical problems    Had a good but hectic summer  Will retire in 2 years - hesitant  Her husband will retire soon   She takes care of herself  Exercises at least 3 days per week Eats healthy - and occ chocolate  Had to cut nuts and popcorn due to diverticulosis   More pedal edema with time  She will wear supp hose in the winter    Wt is down 5 lb with bmi of 25   Zoster status -she has not checked with her ins yet - she does want the vaccine  Flu vaccine today  Td 8/08  Mammogram nl 10/14- she will go to [facility] hospital- she will make her own appt  Self exam - no lumps or changes   Pap 2013 Dr Edwyna Ready    colonosc 5/10 normal with a 10 year recall   Due for labs   Patient Active Problem List   Diagnosis Date Noted  . Tick bite of hip 08/28/2013  . Joint pain 08/28/2013  . Routine general medical examination at a health care facility 01/21/2013  . Stress incontinence in female 01/21/2013  . INTENTION TREMOR 07/17/2007  . ANEMIA 07/10/2007   Past Medical History  Diagnosis Date  . Congenital vascular malformation     hemangiomas being treated with laser surgery by Azzie Roup (Dr. Kalman Shan)  . History of diverticulosis   . History of urinary incontinence   . History of pelvic mass   . Intention tremor   . History of anemia    Past Surgical History  Procedure Laterality Date  . Laser treatment of birth mark      @ [facility] (right leg)  . Cardiovascular stress test    . Esophagogastroduodenoscopy      gastritis  . Bone spur  2008    Rt 5th toe removed   History  Substance Use Topics  . Smoking status: Never Smoker   . Smokeless tobacco: Not on file  . Alcohol Use: No   Family History  Problem Relation Age of Onset  . Stroke Father     x2  . Heart failure Father   . Alzheimer's disease Mother   .  Kidney Stones Brother   . Diabetes Brother   . Bipolar disorder Sister   . Cancer Maternal Grandmother     breast cancer   Allergies  Allergen Reactions  . Amoxicillin     REACTION: rash   No current outpatient prescriptions on file prior to visit.   No current facility-administered medications on file prior to visit.     Review of Systems Review of Systems  Constitutional: Negative for fever, appetite change, fatigue and unexpected weight change.  Eyes: Negative for pain and visual disturbance.  Respiratory: Negative for cough and shortness of breath.   Cardiovascular: Negative for cp or palpitations    Gastrointestinal: Negative for nausea, diarrhea and constipation.  Genitourinary: Negative for urgency and frequency.  Skin: Negative for pallor or rash   Neurological: Negative for weakness, light-headedness, numbness and headaches.  Hematological: Negative for adenopathy. Does not bruise/bleed easily.  Psychiatric/Behavioral: Negative for dysphoric mood. The patient is not nervous/anxious.         Objective:   Physical Exam  Constitutional: She appears well-developed and well-nourished. No distress.  HENT:  Head: Normocephalic and atraumatic.  Right Ear: External ear normal.  Left Ear: External ear normal.  Nose: Nose normal.  Mouth/Throat: Oropharynx is clear and moist.  Eyes: Conjunctivae and EOM are normal. Pupils are equal, round, and reactive to light. Right eye exhibits no discharge. Left eye exhibits no discharge. No scleral icterus.  Neck: Normal range of motion. Neck supple. No JVD present. No thyromegaly present.  Cardiovascular: Normal rate, regular rhythm, normal heart sounds and intact distal pulses.  Exam reveals no gallop.   Pulmonary/Chest: Effort normal and breath sounds normal. No respiratory distress. She has no wheezes. She has no rales.  Abdominal: Soft. Bowel sounds are normal. She exhibits no distension and no mass. There is no tenderness.    Musculoskeletal: She exhibits no edema and no tenderness.  Lymphadenopathy:    She has no cervical adenopathy.  Neurological: She is alert. She has normal reflexes. No cranial nerve deficit. She exhibits normal muscle tone. Coordination normal.  Skin: Skin is warm and dry. No rash noted. No erythema. No pallor.  Psychiatric: She has a normal mood and affect.          Assessment & Plan:   Problem List Items Addressed This Visit     Other   Routine general medical examination at a health care facility     Reviewed health habits including diet and exercise and skin cancer prevention Reviewed appropriate screening tests for age  Also reviewed health mt list, fam hx and immunization status , as well as social and family history    Labs today  Flu vaccine today    Relevant Orders      CBC with Differential      Comprehensive metabolic panel      TSH      Lipid panel    Other Visit Diagnoses   Need for prophylactic vaccination and inoculation against influenza    -  Primary    Relevant Orders       Flu Vaccine QUAD 36+ mos PF IM (Fluarix Quad PF) (Completed)

## 2014-08-26 NOTE — Patient Instructions (Signed)
Don't forget to schedule your mammogram  Take care of yourself  Labs today  If you are interested in a shingles/zoster vaccine - call your insurance to check on coverage,( you should not get it within 1 month of other vaccines) , then call us for a prescription  for it to take to a pharmacy that gives the shot , or make a nurse visit to get it here depending on your coverage

## 2014-08-27 LAB — COMPREHENSIVE METABOLIC PANEL
ALT: 21 U/L (ref 0–35)
AST: 26 U/L (ref 0–37)
Albumin: 4.1 g/dL (ref 3.5–5.2)
Alkaline Phosphatase: 49 U/L (ref 39–117)
BILIRUBIN TOTAL: 0.5 mg/dL (ref 0.2–1.2)
BUN: 16 mg/dL (ref 6–23)
CO2: 26 mEq/L (ref 19–32)
CREATININE: 0.7 mg/dL (ref 0.4–1.2)
Calcium: 8.8 mg/dL (ref 8.4–10.5)
Chloride: 104 mEq/L (ref 96–112)
GFR: 85.67 mL/min (ref >60.00)
Glucose, Bld: 84 mg/dL (ref 70–99)
POTASSIUM: 4.2 meq/L (ref 3.5–5.1)
SODIUM: 137 meq/L (ref 135–145)
Total Protein: 7 g/dL (ref 6.0–8.3)

## 2014-08-27 LAB — CBC WITH DIFFERENTIAL/PLATELET
BASOS ABS: 0 10*3/uL (ref 0.0–0.1)
BASOS PCT: 0.2 % (ref 0.0–3.0)
EOS PCT: 1.3 % (ref 0.0–5.0)
Eosinophils Absolute: 0.1 10*3/uL (ref 0.0–0.7)
HEMATOCRIT: 39.6 % (ref 36.0–46.0)
HEMOGLOBIN: 13.1 g/dL (ref 12.0–15.0)
Lymphocytes Relative: 29.2 % (ref 12.0–46.0)
Lymphs Abs: 1.8 10*3/uL (ref 0.7–4.0)
MCHC: 33.1 g/dL (ref 30.0–36.0)
MCV: 95 fl (ref 78.0–100.0)
MONOS PCT: 4.8 % (ref 3.0–12.0)
Monocytes Absolute: 0.3 10*3/uL (ref 0.1–1.0)
NEUTROS PCT: 64.5 % (ref 43.0–77.0)
Neutro Abs: 4.1 10*3/uL (ref 1.4–7.7)
PLATELETS: 245 10*3/uL (ref 150.0–400.0)
RBC: 4.17 Mil/uL (ref 3.87–5.11)
RDW: 13.6 % (ref 11.5–15.5)
WBC: 6.3 10*3/uL (ref 4.0–10.5)

## 2014-08-27 LAB — LIPID PANEL
Cholesterol: 192 mg/dL (ref 0–200)
HDL: 57.2 mg/dL (ref >39.00)
LDL CALC: 122 mg/dL — AB (ref 0–99)
NONHDL: 134.8
Total CHOL/HDL Ratio: 3
Triglycerides: 62 mg/dL (ref 0.0–149.0)
VLDL: 12.4 mg/dL (ref 0.0–40.0)

## 2014-08-27 LAB — TSH: TSH: 1.03 u[IU]/mL (ref 0.35–4.50)

## 2014-08-27 NOTE — Assessment & Plan Note (Signed)
Reviewed health habits including diet and exercise and skin cancer prevention Reviewed appropriate screening tests for age  Also reviewed health mt list, fam hx and immunization status , as well as social and family history    Labs today  Flu vaccine today

## 2014-08-28 ENCOUNTER — Encounter: Payer: Self-pay | Admitting: Family Medicine

## 2014-09-18 ENCOUNTER — Encounter: Payer: Self-pay | Admitting: Internal Medicine

## 2014-09-18 ENCOUNTER — Encounter: Payer: Self-pay | Admitting: Gastroenterology

## 2014-10-21 ENCOUNTER — Other Ambulatory Visit: Payer: Self-pay | Admitting: Family Medicine

## 2014-10-21 DIAGNOSIS — Z1231 Encounter for screening mammogram for malignant neoplasm of breast: Secondary | ICD-10-CM

## 2014-11-05 ENCOUNTER — Ambulatory Visit (HOSPITAL_COMMUNITY)
Admission: RE | Admit: 2014-11-05 | Discharge: 2014-11-05 | Disposition: A | Discharge status: Home / Self Care | Payer: BC Managed Care – PPO | Source: Ambulatory Visit | Attending: Family Medicine | Admitting: Family Medicine

## 2014-11-05 DIAGNOSIS — Z1231 Encounter for screening mammogram for malignant neoplasm of breast: Secondary | ICD-10-CM | POA: Insufficient documentation

## 2014-11-10 ENCOUNTER — Encounter: Payer: Self-pay | Admitting: *Deleted

## 2014-11-13 ENCOUNTER — Encounter: Payer: Self-pay | Admitting: Internal Medicine

## 2014-11-13 ENCOUNTER — Ambulatory Visit (INDEPENDENT_AMBULATORY_CARE_PROVIDER_SITE_OTHER): Payer: BC Managed Care – PPO | Admitting: Internal Medicine

## 2014-11-13 VITALS — BP 118/74 | HR 95 | Temp 98.4°F | Wt 152.0 lb

## 2014-11-13 DIAGNOSIS — J209 Acute bronchitis, unspecified: Secondary | ICD-10-CM

## 2014-11-13 MED ORDER — AZITHROMYCIN 250 MG PO TABS: ORAL_TABLET | ORAL | Status: DC

## 2014-11-13 MED ORDER — BENZONATATE 200 MG PO CAPS: 200.0000 mg | ORAL_CAPSULE | Freq: Two times a day (BID) | ORAL | Status: DC | PRN

## 2014-11-13 NOTE — Patient Instructions (Signed)

## 2014-11-13 NOTE — Progress Notes (Signed)
Pre visit review using our clinic review tool, if applicable. No additional management support is needed unless otherwise documented below in the visit note. 

## 2014-11-13 NOTE — Progress Notes (Signed)
HPI  Pt presents to the clinic today with c/o cough and chest congestion. She reports this started 3 weeks ago. The cough has been non productive. She reports that 4 days ago she started having runny nose, chills and body aches. She is unsure if she has run any fever. She has tried Mucinex, Ibuprofen and Nyquil without much relief.  She has no history of allergies or breathing problems. She has had sick contacts.  Review of Systems      Past Medical History  Diagnosis Date  . Congenital vascular malformation     hemangiomas being treated with laser surgery by Azzie Roup (Dr. Kalman Shan)  . History of diverticulosis   . History of urinary incontinence   . History of pelvic mass   . Intention tremor   . History of anemia     Family History  Problem Relation Age of Onset  . Stroke Father     x2  . Heart failure Father   . Alzheimer's disease Mother   . Kidney Stones Brother   . Diabetes Brother   . Bipolar disorder Sister   . Cancer Maternal Grandmother     breast cancer    History   Social History  . Marital Status: Married    Spouse Name: N/A    Number of Children: N/A  . Years of Education: N/A   Occupational History  . Not on file.   Social History Main Topics  . Smoking status: Never Smoker   . Smokeless tobacco: Not on file  . Alcohol Use: No  . Drug Use: No  . Sexual Activity: Not on file   Other Topics Concern  . Not on file   Social History Narrative    Allergies  Allergen Reactions  . Amoxicillin     REACTION: rash     Constitutional: Pt reports malaise. Denies headache.  HEENT:  Positive runny nose. Denies eye redness, eye pain, pressure behind the eyes, facial pain, nasal congestion, ear pain, ringing in the ears, wax buildup or sore throat. Respiratory: Positive cough. Denies difficulty breathing or shortness of breath.  Cardiovascular: Denies chest pain, chest tightness, palpitations or swelling in the hands or feet.   No other specific  complaints in a complete review of systems (except as listed in HPI above).  Objective:  BP 118/74 mmHg  Pulse 95  Temp(Src) 98.4 F (36.9 C) (Oral)  Wt 152 lb (68.947 kg)  SpO2 98%  Wt Readings from Last 3 Encounters:  11/13/14 152 lb (68.947 kg)  08/26/14 152 lb 4 oz (69.06 kg)  08/28/13 157 lb 12 oz (71.555 kg)     General: Appears her stated age, ill appearing in NAD. HEENT: Head: normal shape and size;  Ears: Tm's gray and intact, normal light reflex; Nose: mucosa pink and moist, septum midline; Throat/Mouth: Teeth present, mucosa erythematous and moist, no exudate noted, no lesions or ulcerations noted.  No adenopathy noted Cardiovascular: Normal rate and rhythm. S1,S2 noted.  No murmur, rubs or gallops noted.  Pulmonary/Chest: Normal effort and scattered rhonchi throughout. No respiratory distress.      Assessment & Plan:   Acute Bronchitis:  Get some rest and drink plenty of water Ibuprofen/salt water gargles for the sore throat eRx for Azithromax x 5 days eRx for Tessalon Pearls  RTC as needed or if symptoms persist.

## 2015-06-12 LAB — HM PAP SMEAR

## 2015-11-03 ENCOUNTER — Other Ambulatory Visit: Payer: Self-pay

## 2015-11-03 DIAGNOSIS — Z1231 Encounter for screening mammogram for malignant neoplasm of breast: Secondary | ICD-10-CM

## 2015-11-13 ENCOUNTER — Ambulatory Visit (INDEPENDENT_AMBULATORY_CARE_PROVIDER_SITE_OTHER): Payer: BC Managed Care – PPO | Admitting: Family Medicine

## 2015-11-13 ENCOUNTER — Encounter: Payer: Self-pay | Admitting: Family Medicine

## 2015-11-13 VITALS — BP 110/68 | HR 62 | Temp 97.8°F | Ht 65.25 in | Wt 154.5 lb

## 2015-11-13 DIAGNOSIS — Z23 Encounter for immunization: Secondary | ICD-10-CM

## 2015-11-13 DIAGNOSIS — Z0000 Encounter for general adult medical examination without abnormal findings: Secondary | ICD-10-CM

## 2015-11-13 LAB — CBC WITH DIFFERENTIAL/PLATELET
BASOS PCT: 1 % (ref 0–1)
Basophils Absolute: 0.1 10*3/uL (ref 0.0–0.1)
EOS ABS: 0.1 10*3/uL (ref 0.0–0.7)
Eosinophils Relative: 2 % (ref 0–5)
HCT: 37.5 % (ref 36.0–46.0)
Hemoglobin: 13 g/dL (ref 12.0–15.0)
LYMPHS ABS: 1.6 10*3/uL (ref 0.7–4.0)
Lymphocytes Relative: 30 % (ref 12–46)
MCH: 31.5 pg (ref 26.0–34.0)
MCHC: 34.7 g/dL (ref 30.0–36.0)
MCV: 90.8 fL (ref 78.0–100.0)
MONO ABS: 0.4 10*3/uL (ref 0.1–1.0)
MONOS PCT: 7 % (ref 3–12)
MPV: 10 fL (ref 8.6–12.4)
Neutro Abs: 3.2 10*3/uL (ref 1.7–7.7)
Neutrophils Relative %: 60 % (ref 43–77)
PLATELETS: 224 10*3/uL (ref 150–400)
RBC: 4.13 MIL/uL (ref 3.87–5.11)
RDW: 14.3 % (ref 11.5–15.5)
WBC: 5.4 10*3/uL (ref 4.0–10.5)

## 2015-11-13 LAB — COMPREHENSIVE METABOLIC PANEL
ALBUMIN: 4.1 g/dL (ref 3.6–5.1)
ALT: 19 U/L (ref 6–29)
AST: 25 U/L (ref 10–35)
Alkaline Phosphatase: 47 U/L (ref 33–130)
BUN: 15 mg/dL (ref 7–25)
CHLORIDE: 105 mmol/L (ref 98–110)
CO2: 28 mmol/L (ref 20–31)
CREATININE: 0.62 mg/dL (ref 0.50–0.99)
Calcium: 9 mg/dL (ref 8.6–10.4)
Glucose, Bld: 72 mg/dL (ref 65–99)
POTASSIUM: 4.3 mmol/L (ref 3.5–5.3)
SODIUM: 142 mmol/L (ref 135–146)
TOTAL PROTEIN: 6.3 g/dL (ref 6.1–8.1)
Total Bilirubin: 0.8 mg/dL (ref 0.2–1.2)

## 2015-11-13 LAB — LIPID PANEL
CHOL/HDL RATIO: 2.8 ratio (ref <=5.0)
CHOLESTEROL: 181 mg/dL (ref 125–200)
HDL: 65 mg/dL (ref >=46)
LDL Cholesterol: 105 mg/dL (ref <130)
Triglycerides: 53 mg/dL (ref <150)
VLDL: 11 mg/dL (ref <30)

## 2015-11-13 NOTE — Patient Instructions (Signed)
Shingles vaccine today  Labs today  Take care of yourself

## 2015-11-13 NOTE — Progress Notes (Signed)
Subjective:    Patient ID: Elizabeth Duncan, female    DOB: 07/25/1951, 64 y.o.   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   Had pap this summer - fine   Some swelling in ankles occ Her ear hurts a little today   She tries hard to keep up with exercise - she lifts weights and takes classes  Tries to eat healthy (except for candy occ)   Wt is up 2 lb bmi of 25   Hep C/HIV screen Not high risk -declines   Zoster vaccine - she will get a shingles vaccine today - ins will cover   Flu shot - is up to date   Pap - nl this summer   Mm 11/15 nl --has it scheduled for this month Self breast exam  MGM had breast cancer   Td 8/08  Colonoscopy 5/10 -nl with 10 year recall  Saw urologist in the past year also for incontinence - has uterine prolapse   Patient Active Problem List   Diagnosis Date Noted  . Tick bite of hip 08/28/2013  . Joint pain 08/28/2013  . Routine general medical examination at a health care facility 01/21/2013  . Stress incontinence in female 01/21/2013  . INTENTION TREMOR 07/17/2007  . ANEMIA 07/10/2007   Past Medical History  Diagnosis Date  . Congenital vascular malformation     hemangiomas being treated with laser surgery by Azzie Roup (Dr. Kalman Shan)  . History of diverticulosis   . History of urinary incontinence   . History of pelvic mass   . Intention tremor   . History of anemia    Past Surgical History  Procedure Laterality Date  . Laser treatment of birth mark      @ [facility] (right leg)  . Cardiovascular stress test    . Esophagogastroduodenoscopy      gastritis  . Bone spur  2008    Rt 5th toe removed   Social History  Substance Use Topics  . Smoking status: Never Smoker   . Smokeless tobacco: None  . Alcohol Use: No   Family History  Problem Relation Age of Onset  . Stroke Father     x2  . Heart failure Father   .  Alzheimer's disease Mother   . Kidney Stones Brother   . Diabetes Brother   . Bipolar disorder Sister   . Cancer Maternal Grandmother     breast cancer   Allergies  Allergen Reactions  . Amoxicillin     REACTION: rash   No current outpatient prescriptions on file prior to visit.   No current facility-administered medications on file prior to visit.     Patient Active Problem List   Diagnosis Date Noted  . Joint pain 08/28/2013  . Routine general medical examination at a health care facility 01/21/2013  . Stress incontinence in female 01/21/2013  . INTENTION TREMOR 07/17/2007  . ANEMIA 07/10/2007   Past Medical History  Diagnosis Date  . Congenital vascular malformation     hemangiomas being treated with laser surgery by Azzie Roup (Dr. Kalman Shan)  . History of diverticulosis   . History of urinary incontinence   . History of pelvic mass   . Intention tremor   . History of anemia    Past Surgical History  Procedure Laterality Date  . Laser treatment of birth mark      @  [facility] (right leg)  . Cardiovascular stress test    . Esophagogastroduodenoscopy      gastritis  . Bone spur  2008    Rt 5th toe removed   Social History  Substance Use Topics  . Smoking status: Never Smoker   . Smokeless tobacco: None  . Alcohol Use: No   Family History  Problem Relation Age of Onset  . Stroke Father     x2  . Heart failure Father   . Alzheimer's disease Mother   . Kidney Stones Brother   . Diabetes Brother   . Bipolar disorder Sister   . Cancer Maternal Grandmother     breast cancer   Allergies  Allergen Reactions  . Amoxicillin     REACTION: rash   No current outpatient prescriptions on file prior to visit.   No current facility-administered medications on file prior to visit.     Review of Systems Review of Systems  Constitutional: Negative for fever, appetite change, fatigue and unexpected weight change.  Eyes: Negative for pain and visual disturbance.    Respiratory: Negative for cough and shortness of breath.   Cardiovascular: Negative for cp or palpitations    Gastrointestinal: Negative for nausea, diarrhea and constipation.  Genitourinary: Negative for urgency and frequency.  Skin: Negative for pallor or rash   Neurological: Negative for weakness, light-headedness, numbness and headaches.  Hematological: Negative for adenopathy. Does not bruise/bleed easily.  Psychiatric/Behavioral: Negative for dysphoric mood. The patient is not nervous/anxious.         Objective:   Physical Exam  Constitutional: She appears well-developed and well-nourished. No distress.  Well appearing  HENT:  Head: Normocephalic and atraumatic.  Right Ear: External ear normal.  Left Ear: External ear normal.  Nose: Nose normal.  Mouth/Throat: Oropharynx is clear and moist.  Eyes: Conjunctivae and EOM are normal. Pupils are equal, round, and reactive to light. Right eye exhibits no discharge. Left eye exhibits no discharge. No scleral icterus.  Neck: Normal range of motion. Neck supple. No JVD present. Carotid bruit is not present. No thyromegaly present.  Cardiovascular: Normal rate, regular rhythm, normal heart sounds and intact distal pulses.  Exam reveals no gallop.   Pulmonary/Chest: Effort normal and breath sounds normal. No respiratory distress. She has no wheezes. She has no rales.  Abdominal: Soft. Bowel sounds are normal. She exhibits no distension and no mass. There is no tenderness.  Musculoskeletal: She exhibits no edema or tenderness.  Lymphadenopathy:    She has no cervical adenopathy.  Neurological: She is alert. She has normal reflexes. No cranial nerve deficit. She exhibits normal muscle tone. Coordination normal.  Skin: Skin is warm and dry. No rash noted. No erythema. No pallor.  Psychiatric: She has a normal mood and affect.          Assessment & Plan:   Problem List Items Addressed This Visit      Other   Routine general  medical examination at a health care facility - Primary    Reviewed health habits including diet and exercise and skin cancer prevention Reviewed appropriate screening tests for age  Also reviewed health mt list, fam hx and immunization status , as well as social and family history   See HPI Labs ordered Declines HIV/HEP C screen since low risk Zoster vaccine today      Relevant Orders   CBC with Differential/Platelet (Completed)   Comprehensive metabolic panel (Completed)   TSH (Completed)   Lipid panel (Completed)  Varicella-zoster vaccine subcutaneous (Completed)    Other Visit Diagnoses    Need for shingles vaccine        Relevant Orders    Varicella-zoster vaccine subcutaneous (Completed)

## 2015-11-13 NOTE — Progress Notes (Signed)
Pre visit review using our clinic review tool, if applicable. No additional management support is needed unless otherwise documented below in the visit note. 

## 2015-11-14 LAB — TSH: TSH: 1.534 u[IU]/mL (ref 0.350–4.500)

## 2015-11-15 NOTE — Assessment & Plan Note (Addendum)
Reviewed health habits including diet and exercise and skin cancer prevention Reviewed appropriate screening tests for age  Also reviewed health mt list, fam hx and immunization status , as well as social and family history   See HPI Labs ordered Declines HIV/HEP C screen since low risk Zoster vaccine today

## 2015-11-16 ENCOUNTER — Encounter: Payer: Self-pay | Admitting: *Deleted

## 2015-12-04 ENCOUNTER — Ambulatory Visit
Admission: RE | Admit: 2015-12-04 | Discharge: 2015-12-04 | Disposition: A | Discharge status: Home / Self Care | Payer: BC Managed Care – PPO | Source: Ambulatory Visit

## 2015-12-04 DIAGNOSIS — Z1231 Encounter for screening mammogram for malignant neoplasm of breast: Secondary | ICD-10-CM

## 2015-12-04 LAB — HM MAMMOGRAPHY: HM Mammogram: NORMAL

## 2015-12-08 ENCOUNTER — Encounter: Payer: Self-pay | Admitting: *Deleted

## 2015-12-08 ENCOUNTER — Encounter: Payer: Self-pay | Admitting: Family Medicine

## 2016-02-09 ENCOUNTER — Encounter: Payer: Self-pay | Admitting: Sports Medicine

## 2016-02-09 ENCOUNTER — Ambulatory Visit (INDEPENDENT_AMBULATORY_CARE_PROVIDER_SITE_OTHER): Payer: BC Managed Care – PPO | Admitting: Sports Medicine

## 2016-02-09 DIAGNOSIS — M79675 Pain in left toe(s): Secondary | ICD-10-CM

## 2016-02-09 DIAGNOSIS — M778 Other enthesopathies, not elsewhere classified: Secondary | ICD-10-CM

## 2016-02-09 DIAGNOSIS — L84 Corns and callosities: Secondary | ICD-10-CM | POA: Diagnosis not present

## 2016-02-09 DIAGNOSIS — M7752 Other enthesopathy of left foot: Secondary | ICD-10-CM

## 2016-02-09 DIAGNOSIS — Z9889 Other specified postprocedural states: Secondary | ICD-10-CM

## 2016-02-09 DIAGNOSIS — M779 Enthesopathy, unspecified: Secondary | ICD-10-CM

## 2016-02-10 MED ORDER — TRIAMCINOLONE ACETONIDE 10 MG/ML IJ SUSP: 10.0000 mg | Freq: Once | INTRAMUSCULAR | Status: DC

## 2016-02-10 NOTE — Progress Notes (Signed)
Patient ID: Elizabeth Duncan, female   DOB: 1951/03/28, 65 y.o.   MRN: [id number] Subjective: Elizabeth Duncan is a 65 y.o. female patient who presents to office for evaluation of Left foot pain. Patient complains of pain at the lesion present Left foot at the 5th toe for over 1 year. Patient has tried sal acid pads and pumice stone with no relief in symptoms. Patient denies any other pedal complaints.   Admits to previous hammertoe surgery.  Patient Active Problem List   Diagnosis Date Noted  . Joint pain 08/28/2013  . Routine general medical examination at a health care facility 01/21/2013  . Stress incontinence in female 01/21/2013  . INTENTION TREMOR 07/17/2007  . ANEMIA 07/10/2007    No current outpatient prescriptions on file prior to visit.   No current facility-administered medications on file prior to visit.    Allergies  Allergen Reactions  . Amoxicillin     REACTION: rash    Objective:  General: Alert and oriented x3 in no acute distress  Dermatology: Keratotic lesion present dorsal IPJ left 5th toe with central nucleated core noted, no webspace macerations, no ecchymosis bilateral, all nails x 10 are well manicured.  Vascular: Dorsalis Pedis and Posterior Tibial pedal pulses 2/4, Capillary Fill Time 3 seconds, + pedal hair growth bilateral, no edema bilateral lower extremities, Temperature gradient within normal limits.  Neurology: Gross sensation intact via light touch bilateral.  Musculoskeletal: Mild tenderness with palpation at the lesion site on Left with mild swelling and inflammation at 5th toe, Muscular strength 5/5 in all groups without pain or limitation on range of motion. No lower extremity muscular or boney deformity noted.  Assessment and Plan: Problem List Items Addressed This Visit    None    Visit Diagnoses    Corns and callosities    -  Primary    Capsulitis of foot, left        5th toe    Relevant Medications    triamcinolone acetonide  (KENALOG) 10 MG/ML injection 10 mg    Toe pain, left        Status post left foot surgery        Previous hammertoe correction       -Complete examination performed -Discussed treatment options of painful callus at Left 5th toe with inflammation -After oral consent and aseptic prep, injected a mixture containing 0.5 ml of 2%  plain lidocaine, 0.5 ml 0.5% plain marcaine, 0.25 ml of kenalog 10 and 0.25 ml of dexamethasone phosphate into left 5th toe. Post-injection care discussed with patient.  -Parred keratoic lesion using a chisel blade without incident  -Gave patient silicone toe protector -Advised good supportive wide shoes, skin creams and emollients -Patient to return to office as needed or sooner if condition worsens.  Landis Martins, DPM

## 2016-03-18 ENCOUNTER — Ambulatory Visit (INDEPENDENT_AMBULATORY_CARE_PROVIDER_SITE_OTHER): Payer: BC Managed Care – PPO | Admitting: Primary Care

## 2016-03-18 ENCOUNTER — Encounter: Payer: Self-pay | Admitting: Primary Care

## 2016-03-18 VITALS — BP 122/74 | HR 62 | Temp 98.1°F | Ht 62.25 in | Wt 156.1 lb

## 2016-03-18 DIAGNOSIS — R05 Cough: Secondary | ICD-10-CM

## 2016-03-18 DIAGNOSIS — R059 Cough, unspecified: Secondary | ICD-10-CM

## 2016-03-18 MED ORDER — HYDROCODONE-HOMATROPINE 5-1.5 MG/5ML PO SYRP: 5.0000 mL | ORAL_SOLUTION | Freq: Every evening | ORAL | Status: DC | PRN

## 2016-03-18 MED ORDER — BENZONATATE 200 MG PO CAPS: 200.0000 mg | ORAL_CAPSULE | Freq: Three times a day (TID) | ORAL | Status: DC | PRN

## 2016-03-18 NOTE — Progress Notes (Signed)
Subjective:    Patient ID: Elizabeth Duncan, female    DOB: [birth year], 65 y.o.   MRN: [id number]  HPI  Elizabeth Duncan is a 65 year old female who presents today with a chief complaint of cough. She also reports headache, sore throat, chest congestion, post nasal drip. Her symptoms have been present for the past 5 days. Her cough is dry. She's taken Claritin and Robitussin without improvement in symptoms. Denies fevers, sinus pressure, ear pain, body aches. 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   Review of Systems  Constitutional: Negative for fever, chills and fatigue.  HENT: Positive for congestion, postnasal drip and sore throat.   Respiratory: Positive for cough. Negative for shortness of breath and wheezing.   Cardiovascular: Negative for chest pain.  Musculoskeletal: Negative for myalgias.       Past Medical History  Diagnosis Date  . Congenital vascular malformation     hemangiomas being treated with laser surgery by Azzie Roup (Dr. Kalman Shan)  . History of diverticulosis   . History of urinary incontinence   . History of pelvic mass   . Intention tremor   . History of anemia     Social History   Social History  . Marital Status: Married    Spouse Name: N/A  . Number of Children: N/A  . Years of Education: N/A   Occupational History  . Not on file.   Social History Main Topics  . Smoking status: Never Smoker   . Smokeless tobacco: Not on file  . Alcohol Use: No  . Drug Use: No  . Sexual Activity: Not on file   Other Topics Concern  . Not on file   Social History Narrative    Past Surgical History  Procedure Laterality Date  . Laser treatment of birth mark      @ [facility] (right leg)  . Cardiovascular stress test    . Esophagogastroduodenoscopy      gastritis  . Bone spur  2008    Rt 5th toe removed    Family History  Problem Relation Age of Onset  . Stroke Father     x2  . Heart failure Father    . Alzheimer's disease Mother   . Kidney Stones Brother   . Diabetes Brother   . Bipolar disorder Sister   . Cancer Maternal Grandmother     breast cancer    Allergies  Allergen Reactions  . Amoxicillin     REACTION: rash    No current outpatient prescriptions on file prior to visit.   Current Facility-Administered Medications on File Prior to Visit  Medication Dose Route Frequency Provider Last Rate Last Dose  . triamcinolone acetonide (KENALOG) 10 MG/ML injection 10 mg  10 mg Other Once [employer], DPM        BP 122/74 mmHg  Pulse 62  Temp(Src) 98.1 F (36.7 C) (Oral)  Ht 5' 2.25" (1.581 m)  Wt 156 lb 1.9 oz (70.816 kg)  BMI 28.33 kg/m2  SpO2 98%    Objective:   Physical Exam  Constitutional: She appears well-nourished.  HENT:  Right Ear: Tympanic membrane and ear canal normal.  Left Ear: Tympanic membrane and ear canal normal.  Nose: Mucosal edema present. Right sinus exhibits no maxillary sinus tenderness and no frontal sinus tenderness. Left sinus exhibits no maxillary sinus tenderness and no frontal sinus tenderness.  Mouth/Throat: Oropharynx is clear and moist.  Eyes: Conjunctivae are  normal.  Neck: Neck supple.  Cardiovascular: Normal rate and regular rhythm.   Pulmonary/Chest: Effort normal and breath sounds normal. She has no wheezes. She has no rales.  Lymphadenopathy:    She has no cervical adenopathy.  Skin: Skin is warm and dry.          Assessment & Plan:  URI:  Cough, congestion, sore throat, PND x 5 days. Exam with clear lungs, nasal mucosal edema, no evidence of bacterial involvement. Dry cough during exam. Suspect viral process and will treat with supportive measures.  RX for Hycodan for HS and Tessalon Pearls for day cough. Continue Claritin, Mucinex. Fluids, rest, return precautions provided.

## 2016-03-18 NOTE — Patient Instructions (Addendum)
Your symptoms are related to a viral infection that will pass on its own.   Cough/Congestion: Try taking Mucinex DM. This will help loosen up the mucous in your chest. Ensure you take this medication with a full glass of water.  Day Cough: You may take Benzonatate capsules for cough. Take 1 capsule by mouth three times daily as needed for cough.  Night Cough: You may take the Hycodan cough suppressant at bedtime as needed for cough and rest. Caution this medication contains codeine and will make you feel drowsy.  Ensure you are staying hydrated with water.   Please notify me if you develop persistent fevers of 101, start coughing up green mucous, notice increased fatigue or weakness, or feel worse after 1 week of onset of symptoms.   Increase consumption of water intake and rest.  It was a pleasure meeting you!  Upper Respiratory Infection, Adult Most upper respiratory infections (URIs) are a viral infection of the air passages leading to the lungs. A URI affects the nose, throat, and upper air passages. The most common type of URI is nasopharyngitis and is typically referred to as "the common cold." URIs run their course and usually go away on their own. Most of the time, a URI does not require medical attention, but sometimes a bacterial infection in the upper airways can follow a viral infection. This is called a secondary infection. Sinus and middle ear infections are common types of secondary upper respiratory infections. Bacterial pneumonia can also complicate a URI. A URI can worsen asthma and chronic obstructive pulmonary disease (COPD). Sometimes, these complications can require emergency medical care and may be life threatening.  CAUSES Almost all URIs are caused by viruses. A virus is a type of germ and can spread from one person to another.  RISKS FACTORS You may be at risk for a URI if:   You smoke.   You have chronic heart or lung disease.  You have a weakened defense  (immune) system.   You are very young or very old.   You have nasal allergies or asthma.  You work in crowded or poorly ventilated areas.  You work in health care facilities or schools. SIGNS AND SYMPTOMS  Symptoms typically develop 2-3 days after you come in contact with a cold virus. Most viral URIs last 7-10 days. However, viral URIs from the influenza virus (flu virus) can last 14-18 days and are typically more severe. Symptoms may include:   Runny or stuffy (congested) nose.   Sneezing.   Cough.   Sore throat.   Headache.   Fatigue.   Fever.   Loss of appetite.   Pain in your forehead, behind your eyes, and over your cheekbones (sinus pain).  Muscle aches.  DIAGNOSIS  Your health care provider may diagnose a URI by:  Physical exam.  Tests to check that your symptoms are not due to another condition such as:  Strep throat.  Sinusitis.  Pneumonia.  Asthma. TREATMENT  A URI goes away on its own with time. It cannot be cured with medicines, but medicines may be prescribed or recommended to relieve symptoms. Medicines may help:  Reduce your fever.  Reduce your cough.  Relieve nasal congestion. HOME CARE INSTRUCTIONS   Take medicines only as directed by your health care provider.   Gargle warm saltwater or take cough drops to comfort your throat as directed by your health care provider.  Use a warm mist humidifier or inhale steam from a shower to  increase air moisture. This may make it easier to breathe.  Drink enough fluid to keep your urine clear or pale yellow.   Eat soups and other clear broths and maintain good nutrition.   Rest as needed.   Return to work when your temperature has returned to normal or as your health care provider advises. You may need to stay home longer to avoid infecting others. You can also use a face mask and careful hand washing to prevent spread of the virus.  Increase the usage of your inhaler if you  have asthma.   Do not use any tobacco products, including cigarettes, chewing tobacco, or electronic cigarettes. If you need help quitting, ask your health care provider. PREVENTION  The best way to protect yourself from getting a cold is to practice good hygiene.   Avoid oral or hand contact with people with cold symptoms.   Wash your hands often if contact occurs.  There is no clear evidence that vitamin C, vitamin E, echinacea, or exercise reduces the chance of developing a cold. However, it is always recommended to get plenty of rest, exercise, and practice good nutrition.  SEEK MEDICAL CARE IF:   You are getting worse rather than better.   Your symptoms are not controlled by medicine.   You have chills.  You have worsening shortness of breath.  You have brown or red mucus.  You have yellow or brown nasal discharge.  You have pain in your face, especially when you bend forward.  You have a fever.  You have swollen neck glands.  You have pain while swallowing.  You have white areas in the back of your throat. SEEK IMMEDIATE MEDICAL CARE IF:   You have severe or persistent:  Headache.  Ear pain.  Sinus pain.  Chest pain.  You have chronic lung disease and any of the following:  Wheezing.  Prolonged cough.  Coughing up blood.  A change in your usual mucus.  You have a stiff neck.  You have changes in your:  Vision.  Hearing.  Thinking.  Mood. MAKE SURE YOU:   Understand these instructions.  Will watch your condition.  Will get help right away if you are not doing well or get worse.   This information is not intended to replace advice given to you by your health care provider. Make sure you discuss any questions you have with your health care provider.   Document Released: 05/24/2001 Document Revised: 04/14/2015 Document Reviewed: 03/05/2014 Elsevier Interactive Patient Education [employer].

## 2016-05-20 ENCOUNTER — Encounter: Payer: Self-pay | Admitting: Primary Care

## 2016-05-20 ENCOUNTER — Ambulatory Visit (INDEPENDENT_AMBULATORY_CARE_PROVIDER_SITE_OTHER): Payer: BC Managed Care – PPO | Admitting: Primary Care

## 2016-05-20 VITALS — BP 110/78 | HR 56 | Temp 97.9°F | Ht 66.0 in | Wt 154.8 lb

## 2016-05-20 DIAGNOSIS — H938X9 Other specified disorders of ear, unspecified ear: Secondary | ICD-10-CM

## 2016-05-20 NOTE — Patient Instructions (Signed)
Ear Fullness:   Flonase (fluticasone) nasal spray. Instill 1 spray in each nostril twice daily. This will help to reduce pressure deep in the ear tubes.  Zyrtec/Claritin/Allegra: This will help to dry up any fluid that may be present inside the ear drum.  Please notify us if you develop a rash to the site of a tick bite. Also, please notify us if your symptoms do not improve within 2 weeks.  It was a pleasure meeting you!

## 2016-05-20 NOTE — Progress Notes (Signed)
Subjective:    Patient ID: Elizabeth Duncan, female    DOB: 1951/06/04, 65 y.o.   MRN: [id number]  HPI  [name] is a 65 year old female who presents today with a chief complaint of ear fullness. She also noticed slight drainage to her left ear 2 nights ago and was clear in color. The drainage has not occurred since.  She's noticed headaches, nausea, dizziness, neck discomfort. Denies rashes, fevers, sore throat, fatigue, cough. She found a tick on the base of her neck Saturday last weekend.  She will be leaving the country in 2 weeks and would like to ensure she is safe to go.  Review of Systems  Constitutional: Negative for fever and fatigue.  HENT: Negative for congestion, ear pain, sinus pressure and sore throat.        Ear fullness to left ear  Respiratory: Negative for cough.   Musculoskeletal: Positive for neck pain.  Skin: Negative for rash.  Neurological: Positive for headaches.       Past Medical History  Diagnosis Date  . Congenital vascular malformation     hemangiomas being treated with laser surgery by Azzie Roup (Dr. Kalman Shan)  . History of diverticulosis   . History of urinary incontinence   . History of pelvic mass   . Intention tremor   . History of anemia      Social History   Social History  . Marital Status: Married    Spouse Name: N/A  . Number of Children: N/A  . Years of Education: N/A   Occupational History  . Not on file.   Social History Main Topics  . Smoking status: Never Smoker   . Smokeless tobacco: Not on file  . Alcohol Use: No  . Drug Use: No  . Sexual Activity: Not on file   Other Topics Concern  . Not on file   Social History Narrative    Past Surgical History  Procedure Laterality Date  . Laser treatment of birth mark      @ [facility] (right leg)  . Cardiovascular stress test    . Esophagogastroduodenoscopy      gastritis  . Bone spur  2008    Rt 5th toe removed    Family History  Problem Relation Age of Onset  .  Stroke Father     x2  . Heart failure Father   . Alzheimer's disease Mother   . Kidney Stones Brother   . Diabetes Brother   . Bipolar disorder Sister   . Cancer Maternal Grandmother     breast cancer    Allergies  Allergen Reactions  . Amoxicillin     REACTION: rash    No current outpatient prescriptions on file prior to visit.   Current Facility-Administered Medications on File Prior to Visit  Medication Dose Route Frequency Provider Last Rate Last Dose  . triamcinolone acetonide (KENALOG) 10 MG/ML injection 10 mg  10 mg Other Once [employer], DPM        BP 110/78 mmHg  Pulse 56  Temp(Src) 97.9 F (36.6 C)  Ht 5\' 6"  (1.676 m)  Wt 154 lb 12.8 oz (70.217 kg)  BMI 25.00 kg/m2  SpO2 97%    Objective:   Physical Exam  Constitutional: She appears well-nourished.  HENT:  Right Ear: Tympanic membrane and ear canal normal.  Left Ear: Tympanic membrane and ear canal normal.  Nose: Right sinus exhibits no maxillary sinus tenderness and no frontal sinus tenderness. Left sinus exhibits  no maxillary sinus tenderness and no frontal sinus tenderness.  Mouth/Throat: Oropharynx is clear and moist.  Eyes: Conjunctivae are normal.  Neck: Neck supple.  Cardiovascular: Normal rate and regular rhythm.   Pulmonary/Chest: Effort normal and breath sounds normal. She has no wheezes. She has no rales.  Lymphadenopathy:    She has no cervical adenopathy.  Skin: Skin is warm and dry.  No rashes or abnormalities noted to the site of tick bite.          Assessment & Plan:  Ear fullness:  Present for 2 days, with slight drainage that has not recurred. Exam without evidence of effusion or bulging to TMs bilaterally. Suspect allergy related and will treat with supportive measures. Discussed the use of Flonase and/or Zyrtec to help with symptoms. Exam otherwise unremarkable.  Tick bite:  Occurred Saturday last weekend while working around the yard. She has noticed headaches,  dizziness, aches to the site of the tick bite. Exam today without evidence of Lyme disease as she has no rashes and does not appear ill. No treatment needed. Discussed Lyme disease and to watch out for specific symptoms including rashes, fevers, no improvement in current symptoms. She is to notify us if symptoms do not improve in 2 weeks.  Sheral Flow, NP

## 2016-08-18 ENCOUNTER — Ambulatory Visit (INDEPENDENT_AMBULATORY_CARE_PROVIDER_SITE_OTHER): Payer: BC Managed Care – PPO | Admitting: Family Medicine

## 2016-08-18 DIAGNOSIS — Z23 Encounter for immunization: Secondary | ICD-10-CM

## 2016-08-18 NOTE — Progress Notes (Signed)
Flu shot today 

## 2016-08-19 ENCOUNTER — Ambulatory Visit: Payer: BC Managed Care – PPO

## 2016-08-24 ENCOUNTER — Ambulatory Visit: Payer: BC Managed Care – PPO

## 2016-11-12 LAB — HM MAMMOGRAPHY: HM MAMMO: NORMAL (ref 0–4)

## 2017-01-11 ENCOUNTER — Telehealth: Payer: Self-pay | Admitting: Family Medicine

## 2017-01-11 NOTE — Telephone Encounter (Signed)
Pt needs cpe in the next month for a mission trip with church. Is it ok to sch in the next month? Last cpe was 11/13/15.

## 2017-01-11 NOTE — Telephone Encounter (Signed)
That is fine with me - schedule 30 min visit   (I know we are supposed to minimize physicals since we need more urgent care appts so if office manager says no then go with her response)   Thanks Will cc to [place] as well

## 2017-01-16 NOTE — Telephone Encounter (Signed)
Fine with me. Thanks

## 2017-01-22 ENCOUNTER — Telehealth: Payer: Self-pay | Admitting: Family Medicine

## 2017-01-22 DIAGNOSIS — Z0000 Encounter for general adult medical examination without abnormal findings: Secondary | ICD-10-CM

## 2017-01-22 NOTE — Telephone Encounter (Signed)
-----   Message from Ellamae Sia sent at 01/20/2017 10:37 AM EST ----- Regarding: Lab orders for Friday, 2.16.18 Patient is scheduled for CPX labs, please order future labs, Thanks , Karna Christmas

## 2017-01-27 ENCOUNTER — Other Ambulatory Visit (INDEPENDENT_AMBULATORY_CARE_PROVIDER_SITE_OTHER): Payer: BC Managed Care – PPO

## 2017-01-27 DIAGNOSIS — Z0000 Encounter for general adult medical examination without abnormal findings: Secondary | ICD-10-CM

## 2017-01-27 LAB — COMPREHENSIVE METABOLIC PANEL
ALK PHOS: 40 U/L (ref 39–117)
ALT: 17 U/L (ref 0–35)
AST: 22 U/L (ref 0–37)
Albumin: 3.8 g/dL (ref 3.5–5.2)
BUN: 20 mg/dL (ref 6–23)
CO2: 29 mEq/L (ref 19–32)
Calcium: 9 mg/dL (ref 8.4–10.5)
Chloride: 106 mEq/L (ref 96–112)
Creatinine, Ser: 0.73 mg/dL (ref 0.40–1.20)
GFR: 85.01 mL/min (ref >60.00)
GLUCOSE: 79 mg/dL (ref 70–99)
POTASSIUM: 4.4 meq/L (ref 3.5–5.1)
Sodium: 140 mEq/L (ref 135–145)
TOTAL PROTEIN: 6.2 g/dL (ref 6.0–8.3)
Total Bilirubin: 0.5 mg/dL (ref 0.2–1.2)

## 2017-01-27 LAB — CBC WITH DIFFERENTIAL/PLATELET
BASOS ABS: 0 10*3/uL (ref 0.0–0.1)
Basophils Relative: 0.8 % (ref 0.0–3.0)
EOS PCT: 2.4 % (ref 0.0–5.0)
Eosinophils Absolute: 0.1 10*3/uL (ref 0.0–0.7)
HCT: 37.4 % (ref 36.0–46.0)
Hemoglobin: 12.5 g/dL (ref 12.0–15.0)
LYMPHS ABS: 1.7 10*3/uL (ref 0.7–4.0)
Lymphocytes Relative: 32.5 % (ref 12.0–46.0)
MCHC: 33.3 g/dL (ref 30.0–36.0)
MCV: 93.8 fl (ref 78.0–100.0)
Monocytes Absolute: 0.3 10*3/uL (ref 0.1–1.0)
Monocytes Relative: 6.4 % (ref 3.0–12.0)
NEUTROS PCT: 57.9 % (ref 43.0–77.0)
Neutro Abs: 3 10*3/uL (ref 1.4–7.7)
Platelets: 206 10*3/uL (ref 150.0–400.0)
RBC: 3.99 Mil/uL (ref 3.87–5.11)
RDW: 13.4 % (ref 11.5–15.5)
WBC: 5.2 10*3/uL (ref 4.0–10.5)

## 2017-01-27 LAB — LIPID PANEL
CHOL/HDL RATIO: 3
Cholesterol: 181 mg/dL (ref 0–200)
HDL: 59.4 mg/dL (ref >39.00)
LDL CALC: 111 mg/dL — AB (ref 0–99)
NONHDL: 121.84
Triglycerides: 52 mg/dL (ref 0.0–149.0)
VLDL: 10.4 mg/dL (ref 0.0–40.0)

## 2017-01-27 LAB — TSH: TSH: 1.47 u[IU]/mL (ref 0.35–4.50)

## 2017-02-03 ENCOUNTER — Encounter: Payer: BC Managed Care – PPO | Admitting: Family Medicine

## 2017-02-10 ENCOUNTER — Ambulatory Visit (INDEPENDENT_AMBULATORY_CARE_PROVIDER_SITE_OTHER): Payer: BC Managed Care – PPO | Admitting: Family Medicine

## 2017-02-10 ENCOUNTER — Encounter: Payer: Self-pay | Admitting: Family Medicine

## 2017-02-10 VITALS — BP 108/64 | HR 57 | Temp 98.2°F | Ht 65.0 in | Wt 154.0 lb

## 2017-02-10 DIAGNOSIS — Z23 Encounter for immunization: Secondary | ICD-10-CM | POA: Diagnosis not present

## 2017-02-10 DIAGNOSIS — E2839 Other primary ovarian failure: Secondary | ICD-10-CM

## 2017-02-10 DIAGNOSIS — Z111 Encounter for screening for respiratory tuberculosis: Secondary | ICD-10-CM | POA: Diagnosis not present

## 2017-02-10 DIAGNOSIS — Z0000 Encounter for general adult medical examination without abnormal findings: Secondary | ICD-10-CM | POA: Diagnosis not present

## 2017-02-10 LAB — POC URINALSYSI DIPSTICK (AUTOMATED)
BILIRUBIN UA: NEGATIVE
Blood, UA: NEGATIVE
GLUCOSE UA: NEGATIVE
KETONES UA: NEGATIVE
LEUKOCYTES UA: NEGATIVE
NITRITE UA: NEGATIVE
PH UA: 6
Protein, UA: NEGATIVE
Spec Grav, UA: >=1.03
Urobilinogen, UA: 0.2

## 2017-02-10 MED ORDER — PNEUMOCOCCAL 13-VAL CONJ VACC IM SUSP
0.5000 mL | Freq: Once | INTRAMUSCULAR | Status: AC
  Administered 2017-02-10: 0.5 mL via INTRAMUSCULAR

## 2017-02-10 MED ORDER — TETANUS-DIPHTH-ACELL PERTUSSIS 5-2.5-18.5 LF-MCG/0.5 IM SUSP
0.5000 mL | Freq: Once | INTRAMUSCULAR | Status: AC
  Administered 2017-02-10: 0.5 mL via INTRAMUSCULAR

## 2017-02-10 NOTE — Progress Notes (Signed)
Subjective:    Patient ID: Elizabeth Duncan, female    DOB: [birth year], 66 y.o.   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 -not sure where yet-poss [employer] forward to that   [employer] from Last 3 Encounters:  02/10/17 154 lb (69.9 kg)  05/20/16 154 lb 12.8 oz (70.2 kg)  03/18/16 156 lb 1.9 oz (70.8 kg)  down a few lb this year Starting to cut sugar back  Does not drink enough water  Exercise 3-4 days per week  bmi 25.6  Hep C/ HIV screening - declines   Mammogram 12/17 normal Self breast exam   Pap/gyn care : pap 7/16 at gyn  normal Dr Encarnacion Slates not go every year   Zoster vaccine 12/16 Flu vaccine 9/17 Tetanus vaccine 8/08-wants to get today Has not had prevnar yet -will get today  Colonoscopy/ screening : had 5/10 - nl with 10 y recall   dexa -has not had (wants to schedule)  She does not take ca and D No falls or fractures   Needs ua and ppd for her paperwork today   Labs: Results for orders placed or performed in visit on 01/27/17  CBC with Differential/Platelet  Result Value Ref Range   WBC 5.2 4.0 - 10.5 K/uL   RBC 3.99 3.87 - 5.11 Mil/uL   Hemoglobin 12.5 12.0 - 15.0 g/dL   HCT 37.4 36.0 - 46.0 %   MCV 93.8 78.0 - 100.0 fl   MCHC 33.3 30.0 - 36.0 g/dL   RDW 13.4 11.5 - 15.5 %   Platelets 206.0 150.0 - 400.0 K/uL   Neutrophils Relative % 57.9 43.0 - 77.0 %   Lymphocytes Relative 32.5 12.0 - 46.0 %   Monocytes Relative 6.4 3.0 - 12.0 %   Eosinophils Relative 2.4 0.0 - 5.0 %   Basophils Relative 0.8 0.0 - 3.0 %   Neutro Abs 3.0 1.4 - 7.7 K/uL   Lymphs Abs 1.7 0.7 - 4.0 K/uL   Monocytes Absolute 0.3 0.1 - 1.0 K/uL   Eosinophils Absolute 0.1 0.0 - 0.7 K/uL   Basophils Absolute 0.0 0.0 - 0.1 K/uL  Comprehensive metabolic panel  Result Value Ref Range   Sodium 140 135 - 145 mEq/L   Potassium 4.4 3.5 - 5.1 mEq/L   Chloride 106 96 - 112 mEq/L   CO2 29 19 - 32 mEq/L   Glucose, Bld 79 70 - 99 mg/dL   BUN 20 6 - 23 mg/dL   Creatinine, Ser 0.73 0.40 - 1.20 mg/dL   Total Bilirubin 0.5 0.2 - 1.2 mg/dL   Alkaline Phosphatase 40 39 - 117 U/L   AST 22 0 - 37 U/L   ALT 17 0 - 35 U/L   Total Protein 6.2 6.0 - 8.3 g/dL   Albumin 3.8 3.5 - 5.2 g/dL   Calcium 9.0 8.4 - 10.5 mg/dL   GFR 85.01 >60.00 mL/min  Lipid panel  Result Value Ref Range   Cholesterol 181 0 - 200 mg/dL   Triglycerides 52.0 0.0 - 149.0 mg/dL   HDL 59.40 >39.00 mg/dL   VLDL 10.4 0.0 - 40.0 mg/dL   LDL Cholesterol 111 (H) 0 - 99 mg/dL   Total CHOL/HDL Ratio 3    NonHDL 121.84   TSH  Result Value Ref Range   TSH  1.47 0.35 - 4.50 uIU/mL      Patient Active Problem List   Diagnosis Date Noted  . Estrogen deficiency 02/10/2017  . Joint pain 08/28/2013  . Routine general medical examination at a health care facility 01/21/2013  . Stress incontinence in female 01/21/2013  . INTENTION TREMOR 07/17/2007   Past Medical History:  Diagnosis Date  . Congenital vascular malformation    hemangiomas being treated with laser surgery by Azzie Roup (Dr. Kalman Shan)  . History of anemia   . History of diverticulosis   . History of pelvic mass   . History of urinary incontinence   . Intention tremor    Past Surgical History:  Procedure Laterality Date  . bone spur  2008   Rt 5th toe removed  . CARDIOVASCULAR STRESS TEST    . ESOPHAGOGASTRODUODENOSCOPY     gastritis  . laser treatment of birth mark     @ [facility] (right leg)   Social History  Substance Use Topics  . Smoking status: Never Smoker  . Smokeless tobacco: Never Used  . Alcohol use No   Family History  Problem Relation Age of Onset  . Stroke Father     x2  . Heart failure Father   . Alzheimer's disease Mother   . Kidney Stones Brother   . Diabetes Brother   . Bipolar disorder Sister   . Cancer Maternal Grandmother     breast cancer    Allergies  Allergen Reactions  . Amoxicillin     REACTION: rash   No current outpatient prescriptions on file prior to visit.   Current Facility-Administered Medications on File Prior to Visit  Medication Dose Route Frequency Provider Last Rate Last Dose  . triamcinolone acetonide (KENALOG) 10 MG/ML injection 10 mg  10 mg Other Once Landis Martins, DPM        Review of Systems Review of Systems  Constitutional: Negative for fever, appetite change, fatigue and unexpected weight change.  Eyes: Negative for pain and visual disturbance.  Respiratory: Negative for cough and shortness of breath.   Cardiovascular: Negative for cp or palpitations    Gastrointestinal: Negative for nausea, diarrhea and constipation.  Genitourinary: Negative for urgency and frequency.  Skin: Negative for pallor or rash   Neurological: Negative for weakness, light-headedness, numbness and headaches.  Hematological: Negative for adenopathy. Does not bruise/bleed easily.  Psychiatric/Behavioral: Negative for dysphoric mood. The patient is not nervous/anxious.         Objective:   Physical Exam  Constitutional: She appears well-developed and well-nourished. No distress.  Well appearing   HENT:  Head: Normocephalic and atraumatic.  Right Ear: External ear normal.  Left Ear: External ear normal.  Mouth/Throat: Oropharynx is clear and moist.  Eyes: Conjunctivae and EOM are normal. Pupils are equal, round, and reactive to light. No scleral icterus.  Neck: Normal range of motion. Neck supple. No JVD present. Carotid bruit is not present. No thyromegaly present.  Cardiovascular: Normal rate, regular rhythm, normal heart sounds and intact distal pulses.  Exam reveals no gallop.   Pulmonary/Chest: Effort normal and breath sounds normal. No respiratory distress. She has no wheezes. She exhibits no tenderness.  Abdominal: Soft. Bowel sounds are normal. She exhibits no distension, no abdominal bruit and no mass.  There is no tenderness.  Genitourinary: No breast swelling, tenderness, discharge or bleeding.  Genitourinary Comments: Breast exam: No mass, nodules, thickening, tenderness, bulging, retraction, inflamation, nipple discharge or skin changes noted.  No axillary or clavicular LA.  Musculoskeletal: Normal range of motion. She exhibits no edema or tenderness.  Lymphadenopathy:    She has no cervical adenopathy.  Neurological: She is alert. She has normal reflexes. No cranial nerve deficit. She exhibits normal muscle tone. Coordination normal.  No tremor today  Skin: Skin is warm and dry. No rash noted. No erythema. No pallor.  Lentigines diffusely   Psychiatric: She has a normal mood and affect.          Assessment & Plan:   Problem List Items Addressed This Visit      Other   Estrogen deficiency    Ref for dexa  Recommend ca and D for bone health  Continue exercise       Relevant Orders   DG Bone Density   Routine general medical examination at a health care facility - Primary    Reviewed health habits including diet and exercise and skin cancer prevention Reviewed appropriate screening tests for age  Also reviewed health mt list, fam hx and immunization status , as well as social and family history   See HPI Mammogram and gyn care up to date  Tdap today and prevnar  utd colon cancer screening  ua and PPD for her paperwork for mission trip today Screening dexa scheduled        Relevant Orders   POCT Urinalysis Dipstick (Automated) (Completed)    Other Visit Diagnoses    Need for Tdap vaccination       Relevant Medications   Tdap (BOOSTRIX) injection 0.5 mL (Completed)   Need for vaccination with 13-polyvalent pneumococcal conjugate vaccine       Relevant Medications   pneumococcal 13-valent conjugate vaccine (PREVNAR 13) injection 0.5 mL (Completed)   Visit for TB skin test       Relevant Orders   TB Skin Test (Completed)

## 2017-02-10 NOTE — Progress Notes (Signed)
Pre visit review using our clinic review tool, if applicable. No additional management support is needed unless otherwise documented below in the visit note. 

## 2017-02-10 NOTE — Patient Instructions (Addendum)
Goal for fluids is 64 oz day (mostly water)  Tdap vaccine today prevnar vaccine today   Try to get 1200-1500 mg of calcium per day with at least 1000 iu of vitamin D - for bone health   Stop at check out for referral for dexa

## 2017-02-11 NOTE — Assessment & Plan Note (Signed)
Ref for dexa  Recommend ca and D for bone health  Continue exercise

## 2017-02-11 NOTE — Assessment & Plan Note (Signed)
Reviewed health habits including diet and exercise and skin cancer prevention Reviewed appropriate screening tests for age  Also reviewed health mt list, fam hx and immunization status , as well as social and family history   See HPI Mammogram and gyn care up to date  Tdap today and prevnar  utd colon cancer screening  ua and PPD for her paperwork for mission trip today Screening dexa scheduled

## 2017-02-13 LAB — TB SKIN TEST
INDURATION: 0 mm
TB SKIN TEST: NEGATIVE

## 2017-02-28 ENCOUNTER — Ambulatory Visit
Admission: RE | Admit: 2017-02-28 | Discharge: 2017-02-28 | Disposition: A | Discharge status: Home / Self Care | Payer: BC Managed Care – PPO | Source: Ambulatory Visit | Attending: Family Medicine | Admitting: Family Medicine

## 2017-02-28 DIAGNOSIS — E2839 Other primary ovarian failure: Secondary | ICD-10-CM | POA: Diagnosis present

## 2017-03-06 ENCOUNTER — Encounter: Payer: Self-pay | Admitting: *Deleted

## 2017-03-09 ENCOUNTER — Encounter: Payer: Self-pay | Admitting: *Deleted

## 2017-03-13 NOTE — Discharge Instructions (Signed)
[facility] °[facility] ° °POST OPERATIVE INSTRUCTIONS FOR DR. TROXLER AND DR. FOWLER °[facility] ° ° °1. Take your medication as prescribed.  Pain medication should be taken only as needed. ° °2. Keep the dressing clean, dry and intact. ° °3. Keep your foot elevated above the heart level for the first 48 hours. ° °4. Walking to the bathroom and brief periods of walking are acceptable, unless we have instructed you to be non-weight bearing. ° °5. Always wear your post-op shoe when walking.  Always use your crutches if you are to be non-weight bearing. ° °6. Do not take a shower. Baths are permissible as long as the foot is kept out of the water.  ° °7. Every hour you are awake:  °- Bend your knee 15 times. °- Flex foot 15 times °- Massage calf 15 times ° °8. Call [facility] ([phone]) if any of the following problems occur: °- You develop a temperature or fever. °- The bandage becomes saturated with blood. °- Medication does not stop your pain. °- Injury of the foot occurs. °- Any symptoms of infection including redness, odor, or red streaks running from wound. ° ° °General Anesthesia, Adult, Care After °These instructions provide you with information about caring for yourself after your procedure. Your health care provider may also give you more specific instructions. Your treatment has been planned according to current medical practices, but problems sometimes occur. Call your health care provider if you have any problems or questions after your procedure. °What can I expect after the procedure? °After the procedure, it is common to have: °· Vomiting. °· A sore throat. °· Mental slowness. °It is common to feel: °· Nauseous. °· Cold or shivery. °· Sleepy. °· Tired. °· Sore or achy, even in parts of your body where you did not have surgery. °Follow these instructions at home: °For at least 24 hours after the procedure: °· Do not: °¨ Participate in  activities where you could fall or become injured. °¨ Drive. °¨ Use heavy machinery. °¨ Drink alcohol. °¨ Take sleeping pills or medicines that cause drowsiness. °¨ Make important decisions or sign legal documents. °¨ Take care of children on your own. °· Rest. °Eating and drinking °· If you vomit, drink water, juice, or soup when you can drink without vomiting. °· Drink enough fluid to keep your urine clear or pale yellow. °· Make sure you have little or no nausea before eating solid foods. °· Follow the diet recommended by your health care provider. °General instructions °· Have a responsible adult stay with you until you are awake and alert. °· Return to your normal activities as told by your health care provider. Ask your health care provider what activities are safe for you. °· Take over-the-counter and prescription medicines only as told by your health care provider. °· If you smoke, do not smoke without supervision. °· Keep all follow-up visits as told by your health care provider. This is important. °Contact a health care provider if: °· You continue to have nausea or vomiting at home, and medicines are not helpful. °· You cannot drink fluids or start eating again. °· You cannot urinate after 8-12 hours. °· You develop a skin rash. °· You have fever. °· You have increasing redness at the site of your procedure. °Get help right away if: °· You have difficulty breathing. °· You have chest pain. °· You have unexpected bleeding. °· You feel that you are having   a life-threatening or urgent problem. °This information is not intended to replace advice given to you by your health care provider. Make sure you discuss any questions you have with your health care provider. °Document Released: 03/06/2001 Document Revised: 05/02/2016 Document Reviewed: 11/12/2015 °Elsevier Interactive Patient Education © 2017 [employer] ° °

## 2017-03-16 ENCOUNTER — Ambulatory Visit
Admission: RE | Admit: 2017-03-16 | Discharge: 2017-03-16 | Disposition: A | Discharge status: Home / Self Care | Payer: BC Managed Care – PPO | Source: Ambulatory Visit | Attending: Podiatry | Admitting: Podiatry

## 2017-03-16 ENCOUNTER — Ambulatory Visit: Payer: BC Managed Care – PPO | Admitting: Anesthesiology

## 2017-03-16 ENCOUNTER — Encounter
Admission: RE | Disposition: A | Discharge status: Home / Self Care | Payer: Self-pay | Source: Ambulatory Visit | Attending: Podiatry

## 2017-03-16 DIAGNOSIS — M898X7 Other specified disorders of bone, ankle and foot: Secondary | ICD-10-CM | POA: Diagnosis not present

## 2017-03-16 DIAGNOSIS — M79672 Pain in left foot: Secondary | ICD-10-CM | POA: Diagnosis present

## 2017-03-16 HISTORY — PX: EXCISION PARTIAL PHALANX: SHX6617

## 2017-03-16 SURGERY — EXCISION, PHALANX, PARTIAL
Anesthesia: Monitor Anesthesia Care | Site: Fifth Toe | Laterality: Left | Wound class: Clean

## 2017-03-16 SURGERY — EXCISION, PHALANX, PARTIAL
Anesthesia: LOCAL | Laterality: Left

## 2017-03-16 MED ORDER — BUPIVACAINE HCL (PF) 0.25 % IJ SOLN
INTRAMUSCULAR | Status: DC | PRN
  Administered 2017-03-16: 3 mL
  Administered 2017-03-16: 4 mL

## 2017-03-16 MED ORDER — MIDAZOLAM HCL 5 MG/5ML IJ SOLN
INTRAMUSCULAR | Status: DC | PRN
  Administered 2017-03-16: 2 mg via INTRAVENOUS

## 2017-03-16 MED ORDER — HYDROCODONE-ACETAMINOPHEN 5-325 MG PO TABS: 1.0000 | ORAL_TABLET | Freq: Four times a day (QID) | ORAL | 0 refills | Status: DC | PRN

## 2017-03-16 MED ORDER — LIDOCAINE HCL (CARDIAC) 20 MG/ML IV SOLN
INTRAVENOUS | Status: DC | PRN
  Administered 2017-03-16: 30 mg via INTRATRACHEAL
  Administered 2017-03-16: 20 mg via INTRATRACHEAL

## 2017-03-16 MED ORDER — ONDANSETRON HCL 4 MG/2ML IJ SOLN: 4.0000 mg | Freq: Once | INTRAMUSCULAR | Status: DC | PRN

## 2017-03-16 MED ORDER — OXYCODONE HCL 5 MG/5ML PO SOLN: 5.0000 mg | Freq: Once | ORAL | Status: DC | PRN

## 2017-03-16 MED ORDER — FENTANYL CITRATE (PF) 100 MCG/2ML IJ SOLN: 25.0000 ug | INTRAMUSCULAR | Status: DC | PRN

## 2017-03-16 MED ORDER — FENTANYL CITRATE (PF) 100 MCG/2ML IJ SOLN
INTRAMUSCULAR | Status: DC | PRN
  Administered 2017-03-16 (×4): 25 ug via INTRAVENOUS

## 2017-03-16 MED ORDER — OXYCODONE HCL 5 MG PO TABS: 5.0000 mg | ORAL_TABLET | Freq: Once | ORAL | Status: DC | PRN

## 2017-03-16 MED ORDER — PROPOFOL 10 MG/ML IV BOLUS
INTRAVENOUS | Status: DC | PRN
  Administered 2017-03-16: 40 mg via INTRAVENOUS

## 2017-03-16 MED ORDER — LACTATED RINGERS IV SOLN
INTRAVENOUS | Status: DC
  Administered 2017-03-16: 07:00:00 via INTRAVENOUS

## 2017-03-16 MED ORDER — ONDANSETRON HCL 4 MG/2ML IJ SOLN
INTRAMUSCULAR | Status: DC | PRN
  Administered 2017-03-16: 4 mg via INTRAVENOUS

## 2017-03-16 MED ORDER — PROPOFOL 500 MG/50ML IV EMUL
INTRAVENOUS | Status: DC | PRN
  Administered 2017-03-16: 100 ug/kg/min via INTRAVENOUS

## 2017-03-16 MED ORDER — SODIUM CHLORIDE 0.9 % IV SOLN
600.0000 mg | Freq: Once | INTRAVENOUS | Status: AC
  Administered 2017-03-16: 600 mg via INTRAVENOUS

## 2017-03-16 SURGICAL SUPPLY — 52 items
APL SKNCLS STERI-STRIP NONHPOA (GAUZE/BANDAGES/DRESSINGS)
BANDAGE ELASTIC 4 VELCRO NS (GAUZE/BANDAGES/DRESSINGS) ×2 IMPLANT
BENZOIN TINCTURE PRP APPL 2/3 (GAUZE/BANDAGES/DRESSINGS) ×1 IMPLANT
BLADE MINI RND TIP GREEN BEAV (BLADE) ×1 IMPLANT
BLADE OSC/SAGITTAL MD 5.5X18 (BLADE) ×1 IMPLANT
BLADE OSC/SAGITTAL MD 9X18.5 (BLADE) IMPLANT
BNDG CMPR 75X41 PLY HI ABS (GAUZE/BANDAGES/DRESSINGS) ×1
BNDG ESMARK 4X12 TAN STRL LF (GAUZE/BANDAGES/DRESSINGS) ×2 IMPLANT
BNDG GAUZE 4.5X4.1 6PLY STRL (MISCELLANEOUS) ×2 IMPLANT
BNDG STRETCH 4X75 STRL LF (GAUZE/BANDAGES/DRESSINGS) ×2 IMPLANT
CANISTER SUCT 1200ML W/VALVE (MISCELLANEOUS) ×2 IMPLANT
CAST PADDING 3X4FT ST 30246 (SOFTGOODS)
COVER LIGHT HANDLE UNIVERSAL (MISCELLANEOUS) ×4 IMPLANT
COVER PIN YLW 0.028-062 (MISCELLANEOUS) IMPLANT
CUFF TOURN SGL QUICK 18 (TOURNIQUET CUFF) ×2 IMPLANT
DRAPE FLUOR MINI C-ARM 54X84 (DRAPES) ×2 IMPLANT
DURAPREP 26ML APPLICATOR (WOUND CARE) ×2 IMPLANT
GAUZE PETRO XEROFOAM 1X8 (MISCELLANEOUS) ×2 IMPLANT
GAUZE SPONGE 4X4 12PLY STRL (GAUZE/BANDAGES/DRESSINGS) ×2 IMPLANT
GLOVE BIO SURGEON STRL SZ8 (GLOVE) ×4 IMPLANT
GOWN STRL REUS W/ TWL LRG LVL3 (GOWN DISPOSABLE) ×1 IMPLANT
GOWN STRL REUS W/ TWL XL LVL3 (GOWN DISPOSABLE) ×1 IMPLANT
GOWN STRL REUS W/TWL LRG LVL3 (GOWN DISPOSABLE) ×2
GOWN STRL REUS W/TWL XL LVL3 (GOWN DISPOSABLE) ×2
K-WIRE DBL END TROCAR 6X.045 (WIRE)
K-WIRE DBL END TROCAR 6X.062 (WIRE)
KIT ROOM TURNOVER OR (KITS) ×2 IMPLANT
KWIRE DBL END TROCAR 6X.045 (WIRE) IMPLANT
KWIRE DBL END TROCAR 6X.062 (WIRE) IMPLANT
NDL HYPO 18GX1.5 BLUNT FILL (NEEDLE) IMPLANT
NEEDLE HYPO 18GX1.5 BLUNT FILL (NEEDLE) ×2 IMPLANT
NEEDLE HYPO 25GX1X1/2 BEV (NEEDLE) ×2 IMPLANT
NS IRRIG 500ML POUR BTL (IV SOLUTION) ×2 IMPLANT
PACK EXTREMITY ARMC (MISCELLANEOUS) ×2 IMPLANT
PAD CAST CTTN 3X4 STRL (SOFTGOODS) IMPLANT
PAD GROUND ADULT SPLIT (MISCELLANEOUS) ×2 IMPLANT
PADDING CAST COTTON 3X4 STRL (SOFTGOODS)
RASP SM TEAR CROSS CUT (RASP) ×1 IMPLANT
SPLINT CAST 1 STEP 4X30 (MISCELLANEOUS) ×1 IMPLANT
SPLINT FAST PLASTER 5X30 (CAST SUPPLIES)
SPLINT PLASTER CAST FAST 5X30 (CAST SUPPLIES) IMPLANT
STOCKINETTE STRL 6IN 960660 (GAUZE/BANDAGES/DRESSINGS) ×2 IMPLANT
STRAP BODY AND KNEE 60X3 (MISCELLANEOUS) ×2 IMPLANT
STRIP CLOSURE SKIN 1/4X4 (GAUZE/BANDAGES/DRESSINGS) ×1 IMPLANT
SUT ETHILON 5-0 FS-2 18 BLK (SUTURE) ×1 IMPLANT
SUT VIC AB 2-0 SH 27 (SUTURE)
SUT VIC AB 2-0 SH 27XBRD (SUTURE) IMPLANT
SUT VIC AB 3-0 SH 27 (SUTURE)
SUT VIC AB 3-0 SH 27X BRD (SUTURE) IMPLANT
SUT VIC AB 4-0 FS2 27 (SUTURE) ×3 IMPLANT
SUT VICRYL AB 3-0 FS1 BRD 27IN (SUTURE) IMPLANT
SYRINGE 10CC LL (SYRINGE) ×1 IMPLANT

## 2017-03-16 NOTE — Op Note (Signed)
Operative note   Surgeon: Dr. Albertine Patricia, DPM.    Assistant:none    Preop diagnosis: Exostosis fifth toe left foot    Postop diagnosis: Same    Procedure:   1. Partial phalanx resection from proximal and distal phalanx fifth toe left foot          EBL: Less than 5 cc    Anesthesia:IV sedation delivered by the anesthesia team and 4 cc 0.5% Marcaine plain delivered by me as the toe.    Hemostasis: Ankle tourniquet 225 mmHg pressure for 30 minutes    Specimen: None    Complications: None    Operative indications: Patient had chronic pain in the left fifth toe due to shoe pressure irritation pressing up against a bony prominence in the fifth toe. She had surgery on the fifth toe never used to get remove the middle phalanx now she developed some prominence to the dorsal lateral proximal phalanx head and the dorsal lateral distal phalanx base. Resistant to conservative care.    Procedure:  Patient was brought into the OR and placed on the operating table in thesupine position. After anesthesia was obtained theleft lower extremity was prepped and draped in usual sterile fashion.  Operative Report: This time to his directed to the fifth toe of the left foot where a 2 cm linear incision was made and deepened sharp blunt dissection bleeders clamped and bovied as required. Extensor tendon overlying the PIP joint was identified and incised transversely reflected proximally off proximal phalanx and distally off the distal phalanx base. As noted earlier than middle phalanx had been resected number of years ago. A bony prominence is noted on the dorsal lateral aspect of the proximal phalanx was resected and rasped smoothly. This point the distal phalanx was identified and a combination of power equipment and power rasp was used to remove the lateral dorsolateral prominence of bone across that area. Once this was accomplished there was copiously irrigated checked FluoroScan good position was  noted and good reduction of bony proliferation's were noted.  At this time, there was blocked 0.5% Marcaine plain 3 more cc were utilized. Sterile compressive dressings placed across wound consisting of Xeroform gauze 4 x 4's Kling Kerlix the tourniquet was released and prompt complete vascularity seen to return all digits of the left foot.    Patient tolerated the procedure and anesthesia well.  Was transported from the OR to the PACU with all vital signs stable and vascular status intact. To be discharged per routine protocol.  Will follow up in approximately 1 week in the outpatient clinic.

## 2017-03-16 NOTE — Transfer of Care (Signed)
Immediate Anesthesia Transfer of Care Note  Patient: Labrea Eccleston Hillier  Procedure(s) Performed: Procedure(s) with comments: Part excision bone phalanx of left 5th (Left) - IVA local  Patient Location: PACU  Anesthesia Type: MAC  Level of Consciousness: awake, alert  and patient cooperative  Airway and Oxygen Therapy: Patient Spontanous Breathing and Patient connected to supplemental oxygen  Post-op Assessment: Post-op Vital signs reviewed, Patient's Cardiovascular Status Stable, Respiratory Function Stable, Patent Airway and No signs of Nausea or vomiting  Post-op Vital Signs: Reviewed and stable  Complications: No apparent anesthesia complications

## 2017-03-16 NOTE — H&P (Signed)
H and P has been reviewed and no changes are noted.  

## 2017-03-16 NOTE — Anesthesia Preprocedure Evaluation (Addendum)
Anesthesia Evaluation  Patient identified by MRN, date of birth, ID band Patient awake    Reviewed: Allergy & Precautions, H&P , NPO status , Patient's Chart, lab work & pertinent test results, reviewed documented beta blocker date and time   Airway Mallampati: II  TM Distance: >3 FB Neck ROM: full    Dental no notable dental hx.    Pulmonary neg pulmonary ROS,    Pulmonary exam normal breath sounds clear to auscultation       Cardiovascular Exercise Tolerance: Good  Rhythm:regular Rate:Normal  Congenital hemangiomas   Neuro/Psych Intention tremor negative psych ROS   GI/Hepatic negative GI ROS, Neg liver ROS,   Endo/Other  negative endocrine ROS  Renal/GU negative Renal ROS  negative genitourinary   Musculoskeletal   Abdominal   Peds  Hematology negative hematology ROS (+)   Anesthesia Other Findings   Reproductive/Obstetrics negative OB ROS                            Anesthesia Physical Anesthesia Plan  ASA: II  Anesthesia Plan: MAC   Post-op Pain Management:    Induction:   Airway Management Planned:   Additional Equipment:   Intra-op Plan:   Post-operative Plan:   Informed Consent: I have reviewed the patients History and Physical, chart, labs and discussed the procedure including the risks, benefits and alternatives for the proposed anesthesia with the patient or authorized representative who has indicated his/her understanding and acceptance.   Dental Advisory Given  Plan Discussed with: CRNA  Anesthesia Plan Comments:        Anesthesia Quick Evaluation

## 2017-03-16 NOTE — Anesthesia Postprocedure Evaluation (Signed)
Anesthesia Post Note  Patient: Elizabeth Duncan  Procedure(s) Performed: Procedure(s) (LRB): Part excision bone phalanx of left 5th (Left)  Patient location during evaluation: PACU Anesthesia Type: MAC Level of consciousness: awake and alert Pain management: pain level controlled Vital Signs Assessment: post-procedure vital signs reviewed and stable Respiratory status: spontaneous breathing, nonlabored ventilation, respiratory function stable and patient connected to nasal cannula oxygen Cardiovascular status: stable and blood pressure returned to baseline Anesthetic complications: no    Alisa Graff

## 2017-04-27 ENCOUNTER — Telehealth: Payer: Self-pay | Admitting: *Deleted

## 2017-04-27 NOTE — Telephone Encounter (Signed)
I will be on the lookout for it 

## 2017-04-27 NOTE — Telephone Encounter (Signed)
Patient called stating that she has a form that you completed for her when she came in for her physical. Patients stated that the form is for her mission trip and there are some blanks on the form that needs to be completed. Patient stated that she will drop it off tomorrow and you can call her when it is ready for pickup.

## 2017-04-28 ENCOUNTER — Telehealth: Payer: Self-pay | Admitting: Family Medicine

## 2017-04-28 NOTE — Telephone Encounter (Signed)
Patient's husband Delfino Lovett walked in office to drop of Evaluation form for his wife. He stated that he spoke with a triage nurse about having the missing information on the form completed.   Placed in script tower.  Thank you,  -LL

## 2017-04-28 NOTE — Telephone Encounter (Signed)
Done and in IN box 

## 2017-04-28 NOTE — Telephone Encounter (Signed)
Left voicemail letting pt know form ready for pick up (Dr. Glori Bickers completed it)

## 2017-04-28 NOTE — Telephone Encounter (Signed)
Form is in your in box

## 2017-07-12 ENCOUNTER — Encounter: Payer: Self-pay | Admitting: Family Medicine

## 2017-07-12 ENCOUNTER — Ambulatory Visit (INDEPENDENT_AMBULATORY_CARE_PROVIDER_SITE_OTHER): Payer: Medicare Other | Admitting: Family Medicine

## 2017-07-12 VITALS — BP 126/74 | HR 64 | Temp 98.1°F | Ht 65.25 in | Wt 153.5 lb

## 2017-07-12 DIAGNOSIS — Z0000 Encounter for general adult medical examination without abnormal findings: Secondary | ICD-10-CM | POA: Diagnosis not present

## 2017-07-12 NOTE — Progress Notes (Signed)
Subjective:    Patient ID: Elizabeth Duncan, female    DOB: 07-31-1951, 66 y.o.   MRN: [id number]  HPI I have personally reviewed the Medicare Annual Wellness questionnaire and have noted 1. The patient's medical and social history 2. Their use of alcohol, tobacco or illicit drugs 3. Their current medications and supplements 4. The patient's functional ability including ADL's, fall risks, home safety risks and hearing or visual             impairment. 5. Diet and physical activities 6. Evidence for depression or mood disorders  The patients weight, height, BMI have been recorded in the chart and visual acuity is per eye clinic.  I have made referrals, counseling and provided education to the patient based review of the above and I have provided the pt with a written personalized care plan for preventive services. Reviewed and updated provider list, see scanned forms.  Going to [place] in [place] - excited  Will be going on mission trip for a year [place]  state   Wt Readings from Last 3 Encounters:  07/12/17 153 lb 8 oz (69.6 kg)  03/16/17 156 lb (70.8 kg)  02/10/17 154 lb (69.9 kg)  eating healthy- tries hard  Exercise 3 days per week 30-60 min aerobic and weights  25.35 kg/m   See scanned forms.  Routine anticipatory guidance given to patient.  See health maintenance. Colon cancer screening colonoscopy 5/10- 10 year recall  Breast cancer screening  Mammogram 12/17 self rep nl  Self breast exam -no lumps or changes  Pap 7/16 nl at gyn  Flu vaccine 9/17 Tetanus vaccine  Tdap 3/18 Pneumovax prevnar 3/18  Zoster vaccine  zostavax 12/16 dexa 3/18-bmd is in the normal range   Advance directive: has one with living will and poa (getting them re done) Cognitive function addressed- see scanned forms- and if abnormal then additional documentation follows. - no cognitive deficits (her mother had alz)    Hearing Screening   125Hz  250Hz  500Hz  1000Hz  2000Hz   3000Hz  4000Hz  6000Hz  8000Hz   Right ear:   40 40 40  0    Left ear:   40 40 40  40    Vision Screening Comments: Pt had eye exam in July 2018 at [facility]  She has no hearing c/o at all    PMH and SH reviewed  Meds, vitals, and allergies reviewed.   ROS: See HPI.  Otherwise negative.      Chemistry      Component Value Date/Time   NA 140 01/27/2017 1535   K 4.4 01/27/2017 1535   CL 106 01/27/2017 1535   CO2 29 01/27/2017 1535   BUN 20 01/27/2017 1535   CREATININE 0.73 01/27/2017 1535   CREATININE 0.62 11/13/2015 1541      Component Value Date/Time   CALCIUM 9.0 01/27/2017 1535   ALKPHOS 40 01/27/2017 1535   AST 22 01/27/2017 1535   ALT 17 01/27/2017 1535   BILITOT 0.5 01/27/2017 1535      Glucose 79  Cholesterol Lab Results  Component Value Date   CHOL 181 01/27/2017   CHOL 181 11/13/2015   CHOL 192 08/26/2014   Lab Results  Component Value Date   HDL 59.40 01/27/2017   HDL 65 11/13/2015   HDL 57.20 08/26/2014   Lab Results  Component Value Date   LDLCALC 111 (H) 01/27/2017   LDLCALC 105 11/13/2015   LDLCALC 122 (H) 08/26/2014   Lab Results  Component Value Date   TRIG 52.0 01/27/2017   TRIG 53 11/13/2015   TRIG 62.0 08/26/2014   Lab Results  Component Value Date   CHOLHDL 3 01/27/2017   CHOLHDL 2.8 11/13/2015   CHOLHDL 3 08/26/2014   No results found for: LDLDIRECT  Overall stable   Lab Results  Component Value Date   WBC 5.2 01/27/2017   HGB 12.5 01/27/2017   HCT 37.4 01/27/2017   MCV 93.8 01/27/2017   PLT 206.0 01/27/2017    Lab Results  Component Value Date   TSH 1.47 01/27/2017      Patient Active Problem List   Diagnosis Date Noted  . Welcome to Medicare preventive visit 07/12/2017  . Estrogen deficiency 02/10/2017  . Joint pain 08/28/2013  . Routine general medical examination at a health care facility 01/21/2013  . Stress incontinence in female 01/21/2013  . INTENTION TREMOR 07/17/2007   Past Medical  History:  Diagnosis Date  . Congenital vascular malformation    hemangiomas being treated with laser surgery by Azzie Roup (Dr. Kalman Shan)  . History of anemia   . History of diverticulosis   . History of pelvic mass   . History of urinary incontinence   . Intention tremor    Past Surgical History:  Procedure Laterality Date  . bone spur  2008   Rt 5th toe removed  . CARDIOVASCULAR STRESS TEST    . ESOPHAGOGASTRODUODENOSCOPY     gastritis  . EXCISION PARTIAL PHALANX Left 03/16/2017   Procedure: Part excision bone phalanx of left 5th;  Surgeon: Albertine Patricia, DPM;  Location: [place];  Service: Podiatry;  Laterality: Left;  IVA local  . laser treatment of birth mark     @ [facility] (right leg)   Social History  Substance Use Topics  . Smoking status: Never Smoker  . Smokeless tobacco: Never Used  . Alcohol use No   Family History  Problem Relation Age of Onset  . Alzheimer's disease Mother   . Stroke Father        x2  . Heart failure Father   . Kidney Stones Brother   . Diabetes Brother   . Bipolar disorder Sister   . Cancer Maternal Grandmother        breast cancer   Allergies  Allergen Reactions  . Amoxicillin Rash        Current Outpatient Prescriptions on File Prior to Visit  Medication Sig Dispense Refill  . CALCIUM PO Take by mouth.    . Cholecalciferol (VITAMIN D PO) Take by mouth.     No current facility-administered medications on file prior to visit.      Review of Systems Review of Systems  Constitutional: Negative for fever, appetite change, fatigue and unexpected weight change.  Eyes: Negative for pain and visual disturbance.  Respiratory: Negative for cough and shortness of breath.   Cardiovascular: Negative for cp or palpitations    Gastrointestinal: Negative for nausea, diarrhea and constipation.  Genitourinary: Negative for urgency and frequency.  Skin: Negative for pallor or rash   Neurological: Negative for weakness, light-headedness,  numbness and headaches.  Hematological: Negative for adenopathy. Does not bruise/bleed easily.  Psychiatric/Behavioral: Negative for dysphoric mood. The patient is not nervous/anxious.         Objective:   Physical Exam  Constitutional: She appears well-developed and well-nourished. No distress.  Well appearing  HENT:  Head: Normocephalic and atraumatic.  Right Ear: External ear normal.  Left Ear: External ear normal.  Mouth/Throat:  Oropharynx is clear and moist.  Eyes: Pupils are equal, round, and reactive to light. Conjunctivae and EOM are normal. No scleral icterus.  Neck: Normal range of motion. Neck supple. No JVD present. Carotid bruit is not present. No thyromegaly present.  Cardiovascular: Normal rate, regular rhythm, normal heart sounds and intact distal pulses.  Exam reveals no gallop.   Pulmonary/Chest: Effort normal and breath sounds normal. No respiratory distress. She has no wheezes. She exhibits no tenderness.  Abdominal: Soft. Bowel sounds are normal. She exhibits no distension, no abdominal bruit and no mass. There is no tenderness.  Genitourinary: No breast swelling, tenderness, discharge or bleeding.  Genitourinary Comments: Breast exam: No mass, nodules, thickening, tenderness, bulging, retraction, inflamation, nipple discharge or skin changes noted.  No axillary or clavicular LA.      Musculoskeletal: Normal range of motion. She exhibits no edema or tenderness.  Lymphadenopathy:    She has no cervical adenopathy.  Neurological: She is alert. She has normal reflexes. No cranial nerve deficit. She exhibits normal muscle tone. Coordination normal.  No tremor noted today  Skin: Skin is warm and dry. No rash noted. No erythema. No pallor.  Solar lentigines diffusely   Psychiatric: She has a normal mood and affect.          Assessment & Plan:   Problem List Items Addressed This Visit      Other   Welcome to Medicare preventive visit    Reviewed health habits  including diet and exercise and skin cancer prevention Reviewed appropriate screening tests for age  Also reviewed health mt list, fam hx and immunization status , as well as social and family history   See HPI Eye exam utd  Hearing exam -missed one tone  No memory concerns No falls or fx  Rev labs from 2/18  utd imms (she is leaving for 2 y mission trip in [place] and will get a flu shot before she leaves) Good health habits-enc her to continue  Will get [place] a copy of adv dir when it is revised

## 2017-07-12 NOTE — Assessment & Plan Note (Signed)
Reviewed health habits including diet and exercise and skin cancer prevention Reviewed appropriate screening tests for age  Also reviewed health mt list, fam hx and immunization status , as well as social and family history   See HPI Eye exam utd  Hearing exam -missed one tone  No memory concerns No falls or fx  Rev labs from 2/18  utd imms (she is leaving for 2 y mission trip in [place] and will get a flu shot before she leaves) Good health habits-enc her to continue  Will get [place] a copy of adv dir when it is revised

## 2017-07-12 NOTE — Patient Instructions (Addendum)
Come back for a flu shot before you leave for mission   When you get your advance directive re done- drop [place] a copy   Keep taking good care of yourself

## 2017-08-18 ENCOUNTER — Ambulatory Visit (INDEPENDENT_AMBULATORY_CARE_PROVIDER_SITE_OTHER): Payer: Medicare Other

## 2017-08-18 DIAGNOSIS — Z23 Encounter for immunization: Secondary | ICD-10-CM | POA: Diagnosis not present

## 2018-10-30 ENCOUNTER — Encounter: Payer: Self-pay | Admitting: Family Medicine

## 2018-10-30 ENCOUNTER — Ambulatory Visit (INDEPENDENT_AMBULATORY_CARE_PROVIDER_SITE_OTHER): Payer: Medicare Other | Admitting: Family Medicine

## 2018-10-30 VITALS — BP 118/68 | HR 57 | Temp 97.5°F | Ht 65.5 in | Wt 150.5 lb

## 2018-10-30 DIAGNOSIS — Z0000 Encounter for general adult medical examination without abnormal findings: Secondary | ICD-10-CM | POA: Diagnosis not present

## 2018-10-30 DIAGNOSIS — Z23 Encounter for immunization: Secondary | ICD-10-CM

## 2018-10-30 LAB — CBC WITH DIFFERENTIAL/PLATELET
BASOS ABS: 0 10*3/uL (ref 0.0–0.1)
Basophils Relative: 0.9 % (ref 0.0–3.0)
EOS ABS: 0.1 10*3/uL (ref 0.0–0.7)
Eosinophils Relative: 2.1 % (ref 0.0–5.0)
HCT: 41.4 % (ref 36.0–46.0)
Hemoglobin: 13.7 g/dL (ref 12.0–15.0)
LYMPHS ABS: 1.3 10*3/uL (ref 0.7–4.0)
Lymphocytes Relative: 33.2 % (ref 12.0–46.0)
MCHC: 33.2 g/dL (ref 30.0–36.0)
MCV: 94.8 fl (ref 78.0–100.0)
MONO ABS: 0.4 10*3/uL (ref 0.1–1.0)
Monocytes Relative: 8.9 % (ref 3.0–12.0)
NEUTROS PCT: 54.9 % (ref 43.0–77.0)
Neutro Abs: 2.2 10*3/uL (ref 1.4–7.7)
Platelets: 222 10*3/uL (ref 150.0–400.0)
RBC: 4.37 Mil/uL (ref 3.87–5.11)
RDW: 13.5 % (ref 11.5–15.5)
WBC: 4 10*3/uL (ref 4.0–10.5)

## 2018-10-30 LAB — COMPREHENSIVE METABOLIC PANEL
ALBUMIN: 4.1 g/dL (ref 3.5–5.2)
ALK PHOS: 46 U/L (ref 39–117)
ALT: 16 U/L (ref 0–35)
AST: 21 U/L (ref 0–37)
BILIRUBIN TOTAL: 0.6 mg/dL (ref 0.2–1.2)
BUN: 19 mg/dL (ref 6–23)
CALCIUM: 9 mg/dL (ref 8.4–10.5)
CO2: 29 mEq/L (ref 19–32)
Chloride: 106 mEq/L (ref 96–112)
Creatinine, Ser: 0.67 mg/dL (ref 0.40–1.20)
GFR: 93.35 mL/min (ref >60.00)
Glucose, Bld: 89 mg/dL (ref 70–99)
Potassium: 4.3 mEq/L (ref 3.5–5.1)
Sodium: 141 mEq/L (ref 135–145)
TOTAL PROTEIN: 6.5 g/dL (ref 6.0–8.3)

## 2018-10-30 LAB — LIPID PANEL
CHOLESTEROL: 190 mg/dL (ref 0–200)
HDL: 60.2 mg/dL (ref >39.00)
LDL Cholesterol: 118 mg/dL — ABNORMAL HIGH (ref 0–99)
NonHDL: 129.64
TRIGLYCERIDES: 58 mg/dL (ref 0.0–149.0)
Total CHOL/HDL Ratio: 3
VLDL: 11.6 mg/dL (ref 0.0–40.0)

## 2018-10-30 LAB — TSH: TSH: 2.21 u[IU]/mL (ref 0.35–4.50)

## 2018-10-30 NOTE — Progress Notes (Signed)
Subjective:    Patient ID: Elizabeth Duncan, female    DOB: October 01, 1951, 67 y.o.   MRN: [id number]  HPI Here for annual exam and medicare wellness Doing well overall  Did a year mission trip in [place] state   I have personally reviewed the Medicare Annual Wellness questionnaire and have noted 1. The patient's medical and social history 2. Their use of alcohol, tobacco or illicit drugs 3. Their current medications and supplements 4. The patient's functional ability including ADL's, fall risks, home safety risks and hearing or visual             impairment. 5. Diet and physical activities 6. Evidence for depression or mood disorders  The patients weight, height, BMI have been recorded in the chart and visual acuity is per eye clinic.  I have made referrals, counseling and provided education to the patient based review of the above and I have provided the pt with a written personalized care plan for preventive services. Reviewed and updated provider list, see scanned forms.  See scanned forms.  Routine anticipatory guidance given to patient.  See health maintenance. Colon cancer screening 5/10 colonoscopy- 10 y recall  Breast cancer screening  Mammogram 12/17-had one dec 2018 fine , then found a lump in august - she (living in [place] for a year) - recheck and it was ok  Self breast exam -no lumps  Flu vaccine - given today Tetanus vaccine 3/18  Pneumovax  prevnar 3/18, will PNA 23 today Zoster vaccine 12/16 dexa 3/18 -normal  Range  Advance directive-has a living will and POA (her husband)  Cognitive function addressed- see scanned forms- and if abnormal then additional documentation follows. -no concerns  Is interested in a mindfulness study in [place]  Mother had ALZ  Wt Readings from Last 3 Encounters:  10/30/18 150 lb 8 oz (68.3 kg)  07/12/17 153 lb 8 oz (69.6 kg)  03/16/17 156 lb (70.8 kg)   24.66 kg/m   PMH and SH reviewed  Meds, vitals, and allergies reviewed.   ROS: See  HPI.  Otherwise negative.     Hearing Screening   125Hz  250Hz  500Hz  1000Hz  2000Hz  3000Hz  4000Hz  6000Hz  8000Hz   Right ear:   40 40 40  0    Left ear:   40 40 40  40    Vision Screening Comments: Pt ha eye exam at [facility] at 04/11/18 does notice r ear occ feels pressure   Has a spot of ringworm -R shoulder/arm - treating with lotrimin Is helping   Due for labs   Exercises 6 days per week    Patient Active Problem List   Diagnosis Date Noted  . Medicare annual wellness visit, initial 10/30/2018  . Welcome to Medicare preventive visit 07/12/2017  . Estrogen deficiency 02/10/2017  . Joint pain 08/28/2013  . Routine general medical examination at a health care facility 01/21/2013  . Stress incontinence in female 01/21/2013  . INTENTION TREMOR 07/17/2007   Past Medical History:  Diagnosis Date  . Congenital vascular malformation    hemangiomas being treated with laser surgery by Azzie Roup (Dr. Kalman Shan)  . History of anemia   . History of diverticulosis   . History of pelvic mass   . History of urinary incontinence   . Intention tremor    Past Surgical History:  Procedure Laterality Date  . bone spur  2008   Rt 5th toe removed  . CARDIOVASCULAR STRESS TEST    . ESOPHAGOGASTRODUODENOSCOPY  gastritis  . EXCISION PARTIAL PHALANX Left 03/16/2017   Procedure: Part excision bone phalanx of left 5th;  Surgeon: Albertine Patricia, DPM;  Location: [place];  Service: Podiatry;  Laterality: Left;  IVA local  . laser treatment of birth mark     @ [facility] (right leg)   Social History   Tobacco Use  . Smoking status: Never Smoker  . Smokeless tobacco: Never Used  Substance Use Topics  . Alcohol use: No    Alcohol/week: 0.0 standard drinks  . Drug use: No   Family History  Problem Relation Age of Onset  . Alzheimer's disease Mother   . Stroke Father        x2  . Heart failure Father   . Kidney Stones Brother   . Diabetes Brother   . Bipolar disorder Sister   .  Cancer Maternal Grandmother        breast cancer   Allergies  Allergen Reactions  . Amoxicillin Rash        No current outpatient medications on file prior to visit.   No current facility-administered medications on file prior to visit.      Review of Systems  Constitutional: Negative for activity change, appetite change, fatigue, fever and unexpected weight change.  HENT: Negative for congestion, ear pain, rhinorrhea, sinus pressure and sore throat.   Eyes: Negative for pain, redness and visual disturbance.  Respiratory: Negative for cough, shortness of breath and wheezing.   Cardiovascular: Negative for chest pain and palpitations.  Gastrointestinal: Negative for abdominal pain, blood in stool, constipation and diarrhea.  Endocrine: Negative for polydipsia and polyuria.  Genitourinary: Negative for dysuria, frequency and urgency.  Musculoskeletal: Negative for arthralgias, back pain and myalgias.  Skin: Negative for pallor and rash.  Allergic/Immunologic: Negative for environmental allergies.  Neurological: Negative for dizziness, syncope and headaches.  Hematological: Negative for adenopathy. Does not bruise/bleed easily.  Psychiatric/Behavioral: Negative for decreased concentration and dysphoric mood. The patient is not nervous/anxious.        Objective:   Physical Exam  Constitutional: She appears well-developed and well-nourished. No distress.  Well appearing   HENT:  Head: Normocephalic and atraumatic.  Right Ear: External ear normal.  Left Ear: External ear normal.  Mouth/Throat: Oropharynx is clear and moist.  Eyes: Pupils are equal, round, and reactive to light. Conjunctivae and EOM are normal. No scleral icterus.  Neck: Normal range of motion. Neck supple. No JVD present. Carotid bruit is not present. No thyromegaly present.  Cardiovascular: Normal rate, regular rhythm, normal heart sounds and intact distal pulses. Exam reveals no gallop.  Pulmonary/Chest: Effort  normal and breath sounds normal. No respiratory distress. She has no wheezes. She has no rales. She exhibits no tenderness. No breast tenderness, discharge or bleeding.  Abdominal: Soft. Bowel sounds are normal. She exhibits no distension, no abdominal bruit and no mass. There is no tenderness.  Genitourinary: No breast tenderness, discharge or bleeding.  Genitourinary Comments: Breast exam: No mass, nodules, thickening, tenderness, bulging, retraction, inflamation, nipple discharge or skin changes noted.  No axillary or clavicular LA.      Musculoskeletal: Normal range of motion. She exhibits no edema or tenderness.  Lymphadenopathy:    She has no cervical adenopathy.  Neurological: She is alert. She has normal reflexes. She displays normal reflexes. No cranial nerve deficit or sensory deficit. She exhibits normal muscle tone. Coordination normal.  Skin: Skin is warm and dry. No rash noted. No erythema. No pallor.  Solar lentigines diffusely  Psychiatric: She has a normal mood and affect.  Pleasant           Assessment & Plan:   Problem List Items Addressed This Visit      Other   Routine general medical examination at a health care facility    Reviewed health habits including diet and exercise and skin cancer prevention Reviewed appropriate screening tests for age  Also reviewed health mt list, fam hx and immunization status , as well as social and family history   See HPI Labs ordered Good health habits  Flu vaccine today  PNA 23 vaccine today  [place] intake of ca and D for bone health       Relevant Orders   CBC with Differential/Platelet (Completed)   Lipid panel (Completed)   TSH (Completed)   Comprehensive metabolic panel (Completed)   Medicare annual wellness visit, initial - Primary    Reviewed health habits including diet and exercise and skin cancer prevention Reviewed appropriate screening tests for age  Also reviewed  health mt list, fam hx and immunization status , as well as social and family history   See HPI Labs ordered Good health habits  Flu vaccine today  PNA 23 vaccine today  Rev mammogram [place] from out [place] 9[place]19 Enc intake of ca and D for bone health       Relevant Orders   Flu Vaccine QUAD 6+ mos PF IM (Fluarix Quad PF) (Completed)   Pneumococcal polysaccharide vaccine 23-valent greater than or equal to 2yo subcutaneous/IM (Completed)    Other Visit Diagnoses    Need for 23-polyvalent pneumococcal polysaccharide vaccine       Relevant Orders   Pneumococcal polysaccharide vaccine 23-valent greater than or equal to 2yo subcutaneous/IM (Completed)   Need for influenza vaccination       Relevant Orders   Flu Vaccine QUAD 6+ mos PF IM (Fluarix Quad PF) (Completed)

## 2018-10-30 NOTE — Assessment & Plan Note (Addendum)
Reviewed health habits including diet and exercise and skin cancer prevention Reviewed appropriate screening tests for age  Also reviewed health mt list, fam hx and immunization status , as well as social and family history   See HPI Labs ordered Good health habits  Flu vaccine today  PNA 23 vaccine today  [place] 9[place] Enc intake of ca and D for bone health

## 2018-10-30 NOTE — Patient Instructions (Addendum)
Try to get 1200-1500 mg of calcium per day with at least 1000 iu of vitamin D - for bone health   Take care of yourself  Eat a healthy diet --- Try to get most of your carbohydrates from produce (with the exception of white potatoes)  Eat less bread/pasta/rice/snack foods/cereals/sweets and other items from the middle of the grocery store (processed carbs)  Stay active - fitness is important   Labs today

## 2018-10-30 NOTE — Assessment & Plan Note (Signed)
Reviewed health habits including diet and exercise and skin cancer prevention Reviewed appropriate screening tests for age  Also reviewed health mt list, fam hx and immunization status , as well as social and family history   See HPI Labs ordered Good health habits  Flu vaccine today  PNA 23 vaccine today  Rev mammogram report from [place] 9/19 Enc intake of ca and D for bone health

## 2019-04-24 ENCOUNTER — Encounter: Payer: Self-pay | Admitting: Gastroenterology

## 2019-04-26 ENCOUNTER — Encounter: Payer: Self-pay | Admitting: Family Medicine

## 2019-04-26 ENCOUNTER — Ambulatory Visit (INDEPENDENT_AMBULATORY_CARE_PROVIDER_SITE_OTHER): Payer: Medicare Other | Admitting: Family Medicine

## 2019-04-26 DIAGNOSIS — B354 Tinea corporis: Secondary | ICD-10-CM

## 2019-04-26 MED ORDER — KETOCONAZOLE 2 % EX CREA: 1.0000 "application " | TOPICAL_CREAM | Freq: Two times a day (BID) | CUTANEOUS | 1 refills | Status: DC

## 2019-04-26 NOTE — Patient Instructions (Signed)
Try the ketoconazole cream 2% twice daily to affected areas in small amount  Keep clean with soap and water  Also keep very dry /as much as possible  If worse or no improvement in the next 7-10 days alert me - we may need to try a different class of medicine  If still no improvement may need to see in the office

## 2019-04-26 NOTE — Assessment & Plan Note (Signed)
On R upper arm/shoulder and R antecubital area  Usually sensitive to lotrimin but not working this time Px ketoconazole cream 2 % to use bid inst to keep areas clean and very dry as well and protect from the sun If no imp or worse -may consider different class of medicine or re eval in person  If pet dog has it-enc tx of the animal as well

## 2019-04-26 NOTE — Progress Notes (Signed)
Virtual Visit via Video Note  I connected with Elizabeth Duncan on 04/26/19 at 11:30 AM EDT by a video enabled telemedicine application and verified that I am speaking with the correct person using two identifiers.  Location: Patient: home Provider: office    I discussed the limitations of evaluation and management by telemedicine and the availability of in person appointments. The patient expressed understanding and agreed to proceed.  History of Present Illness: Pt presents with a skin lesion she suspects is ringworm Using lotrimin otc/not helping this time  On and off for years   Small spot on wrist in jan-lotrimin helped  Now on L shoulder area and anticubital  Itchy off an on Getting bigger   Thinks she got it from her dog   Is out a lot gardening No tick bites   Review of Systems  Constitutional: Negative for chills, fever, malaise/fatigue and weight loss.  HENT: Positive for sore throat.   Eyes: Negative for discharge and redness.  Respiratory: Negative for cough and shortness of breath.   Cardiovascular: Negative for chest pain and palpitations.  Musculoskeletal: Negative for joint pain and myalgias.  Skin: Positive for itching and rash.  Neurological: Negative for headaches.      Patient Active Problem List   Diagnosis Date Noted  . Ringworm of body 04/26/2019  . Medicare annual wellness visit, initial 10/30/2018  . Welcome to Medicare preventive visit 07/12/2017  . Estrogen deficiency 02/10/2017  . Routine general medical examination at a health care facility 01/21/2013  . Stress incontinence in female 01/21/2013  . INTENTION TREMOR 07/17/2007   Past Medical History:  Diagnosis Date  . Congenital vascular malformation    hemangiomas being treated with laser surgery by Azzie Roup (Dr. Kalman Shan)  . History of anemia   . History of diverticulosis   . History of pelvic mass   . History of urinary incontinence   . Intention tremor    Past Surgical  History:  Procedure Laterality Date  . bone spur  2008   Rt 5th toe removed  . CARDIOVASCULAR STRESS TEST    . ESOPHAGOGASTRODUODENOSCOPY     gastritis  . EXCISION PARTIAL PHALANX Left 03/16/2017   Procedure: Part excision bone phalanx of left 5th;  Surgeon: Albertine Patricia, DPM;  Location: [place];  Service: Podiatry;  Laterality: Left;  IVA local  . laser treatment of birth mark     @ [facility] (right leg)   Social History   Tobacco Use  . Smoking status: Never Smoker  . Smokeless tobacco: Never Used  Substance Use Topics  . Alcohol use: No    Alcohol/week: 0.0 standard drinks  . Drug use: No   Family History  Problem Relation Age of Onset  . Alzheimer's disease Mother   . Stroke Father        x2  . Heart failure Father   . Kidney Stones Brother   . Diabetes Brother   . Bipolar disorder Sister   . Cancer Maternal Grandmother        breast cancer   Allergies  Allergen Reactions  . Amoxicillin Rash        No current outpatient medications on file prior to visit.   No current facility-administered medications on file prior to visit.     Observations/Objective: Patient appears well, in no distress Weight is baseline  No facial swelling or asymmetry Normal voice-not hoarse and no slurred speech No obvious tremor or mobility impairment Moving neck and UEs normally  Able to hear the call well  No cough or shortness of breath during interview  Talkative and mentally sharp with no cognitive changes No skin changes on face or neck , no rash or pallor Skin lesion on L upper arm- oval/tan with some central clearing and mild scale Skin lesion on L middle arm (anticubital area) - more pink/ also with scale and central clearing  No signs of infection or drainage evident  Affect is normal    Assessment and Plan: Problem List Items Addressed This Visit      Musculoskeletal and Integument   Ringworm of body - Primary    On R upper arm/shoulder and R antecubital  area  Usually sensitive to lotrimin but not working this time Px ketoconazole cream 2 % to use bid inst to keep areas clean and very dry as well and protect from the sun If no imp or worse -may consider different class of medicine or re eval in person  If pet dog has it-enc tx of the animal as well        Relevant Medications   ketoconazole (NIZORAL) 2 % cream       Follow Up Instructions: Try the ketoconazole cream 2% twice daily to affected areas in small amount  Keep clean with soap and water  Also keep very dry /as much as possible  If worse or no improvement in the next 7-10 days alert me - we may need to try a different class of medicine  If still no improvement may need to see in the office    I discussed the assessment and treatment plan with the patient. The patient was provided an opportunity to ask questions and all were answered. The patient agreed with the plan and demonstrated an understanding of the instructions.   The patient was advised to call back or seek an in-person evaluation if the symptoms worsen or if the condition fails to improve as anticipated.     Loura Pardon, MD

## 2019-05-15 ENCOUNTER — Telehealth: Payer: Self-pay

## 2019-05-15 NOTE — Telephone Encounter (Signed)
I spoke to patient. She couldn't come in today.  Patient scheduled appointment with Dr.Tower on 05/16/19 at 9:00.

## 2019-05-15 NOTE — Telephone Encounter (Signed)
Copied from [place] [id number]. Topic: General - Other >> May 14, 2019  4:30 PM Elizabeth Duncan wrote: Reason for CRM: Patient called to request Duncan call back for an appointment due to hurting her right wrist playing with her dog also she have Duncan cyst in the palm of her right hand. Ph# [phone]

## 2019-05-16 ENCOUNTER — Encounter: Payer: Self-pay | Admitting: Family Medicine

## 2019-05-16 ENCOUNTER — Other Ambulatory Visit: Payer: Self-pay

## 2019-05-16 ENCOUNTER — Ambulatory Visit (INDEPENDENT_AMBULATORY_CARE_PROVIDER_SITE_OTHER)
Admission: RE | Admit: 2019-05-16 | Discharge: 2019-05-16 | Disposition: A | Discharge status: Home / Self Care | Payer: Medicare Other | Source: Ambulatory Visit | Attending: Family Medicine | Admitting: Family Medicine

## 2019-05-16 ENCOUNTER — Telehealth: Payer: Self-pay | Admitting: Family Medicine

## 2019-05-16 ENCOUNTER — Ambulatory Visit: Payer: Medicare Other | Admitting: Family Medicine

## 2019-05-16 VITALS — BP 128/74 | HR 60 | Temp 97.8°F | Ht 65.5 in | Wt 150.0 lb

## 2019-05-16 DIAGNOSIS — R21 Rash and other nonspecific skin eruption: Secondary | ICD-10-CM

## 2019-05-16 DIAGNOSIS — IMO0002 Reserved for concepts with insufficient information to code with codable children: Secondary | ICD-10-CM

## 2019-05-16 DIAGNOSIS — M25531 Pain in right wrist: Secondary | ICD-10-CM

## 2019-05-16 DIAGNOSIS — R229 Localized swelling, mass and lump, unspecified: Secondary | ICD-10-CM | POA: Diagnosis not present

## 2019-05-16 DIAGNOSIS — B354 Tinea corporis: Secondary | ICD-10-CM | POA: Diagnosis not present

## 2019-05-16 MED ORDER — TRIAMCINOLONE ACETONIDE 0.1 % EX CREA: 1.0000 "application " | TOPICAL_CREAM | Freq: Two times a day (BID) | CUTANEOUS | 1 refills | Status: DC

## 2019-05-16 NOTE — Assessment & Plan Note (Signed)
With tenderness in distal ulna after trauma 5 mo ago  Pain with lifting /resisted flex and ext  No swelling or bruising

## 2019-05-16 NOTE — Assessment & Plan Note (Signed)
Lump (nontender) felt over R palm that is non tender Firm but cannot tell if fluctuant  ? If ganglion cyst vs thickened tendon (Dupuytren's), vs other  Ref to hand specialist

## 2019-05-16 NOTE — Patient Instructions (Signed)
Use triamcinolone on the lower arm rash/lesion  Continue the ketoconazole on the upper one   Xray of hand and wrist now - we will call you with a result and plan   I am considering hand specialist for the lump in your palm   Ice for pain as needed

## 2019-05-16 NOTE — Assessment & Plan Note (Signed)
In antecubital area of L arm -not responding to ketoconazole  Suspect dermatitis/perhaps eczema  Trial of triamcinolone cream Try to avoid hot water/harsh detergents and antigens Update if not starting to improve in a week or if worsening

## 2019-05-16 NOTE — Telephone Encounter (Signed)
-----   Message from Tammi Sou, [place] sent at 05/16/2019 11:34 AM EDT ----- Pt notified of xray results and Dr. Marliss Coots comments. Pt agrees with referral to hand specialist. Please put referral in and I advised pt our [facility] will call to schedule appt

## 2019-05-16 NOTE — Telephone Encounter (Signed)
Referral done Will route to [facility]  

## 2019-05-16 NOTE — Progress Notes (Signed)
Subjective:    Patient ID: Elizabeth Duncan, female    DOB: February 05, 1951, 68 y.o.   MRN: [id number]  HPI  Pt presents for wrist pain and possible cyst on R hand   In January was playing with her dog-he ran into her hand with a ball  Painful Wore a wrist brace for a while   Still hurts to lift things Has a bump on palm of her hand   Seen prev from ringworm tx with nizoral cream- area on upper L arm resolved The one on lower arm did not -? If something else   Hurts to twist arm and flex against resistance No n/t or loss of strength   Has small bump on that palm-not painful  (also R hand) Unsure when it came up  It lines up with the ring finger  Was tx recently for ringworm= area on L upper arm almost gone with nizoral cream  Area in antecubital area is still there-pink/rough/occ itches    Patient Active Problem List   Diagnosis Date Noted  . Rash and nonspecific skin eruption 05/16/2019  . Right wrist pain 05/16/2019  . Lump 05/16/2019  . Ringworm of body 04/26/2019  . Medicare annual wellness visit, initial 10/30/2018  . Welcome to Medicare preventive visit 07/12/2017  . Estrogen deficiency 02/10/2017  . Routine general medical examination at a health care facility 01/21/2013  . Stress incontinence in female 01/21/2013  . INTENTION TREMOR 07/17/2007   Past Medical History:  Diagnosis Date  . Congenital vascular malformation    hemangiomas being treated with laser surgery by Azzie Roup (Dr. Kalman Shan)  . History of anemia   . History of diverticulosis   . History of pelvic mass   . History of urinary incontinence   . Intention tremor    Past Surgical History:  Procedure Laterality Date  . bone spur  2008   Rt 5th toe removed  . CARDIOVASCULAR STRESS TEST    . ESOPHAGOGASTRODUODENOSCOPY     gastritis  . EXCISION PARTIAL PHALANX Left 03/16/2017   Procedure: Part excision bone phalanx of left 5th;  Surgeon: Albertine Patricia, DPM;  Location: [place];   Service: Podiatry;  Laterality: Left;  IVA local  . laser treatment of birth mark     @ [facility] (right leg)   Social History   Tobacco Use  . Smoking status: Never Smoker  . Smokeless tobacco: Never Used  Substance Use Topics  . Alcohol use: No    Alcohol/week: 0.0 standard drinks  . Drug use: No   Family History  Problem Relation Age of Onset  . Alzheimer's disease Mother   . Stroke Father        x2  . Heart failure Father   . Kidney Stones Brother   . Diabetes Brother   . Bipolar disorder Sister   . Cancer Maternal Grandmother        breast cancer   Allergies  Allergen Reactions  . Amoxicillin Rash        Current Outpatient Medications on File Prior to Visit  Medication Sig Dispense Refill  . ketoconazole (NIZORAL) 2 % cream Apply 1 application topically 2 (two) times daily. Apply small amount to affected areas 15 g 1   No current facility-administered medications on file prior to visit.     Review of Systems  Constitutional: Negative for activity change, appetite change, fatigue, fever and unexpected weight change.  HENT: Negative for congestion, ear pain, rhinorrhea, sinus pressure and  sore throat.   Eyes: Negative for pain, redness and visual disturbance.  Respiratory: Negative for cough, shortness of breath and wheezing.   Cardiovascular: Negative for chest pain and palpitations.  Gastrointestinal: Negative for abdominal pain, blood in stool, constipation and diarrhea.  Endocrine: Negative for polydipsia and polyuria.  Genitourinary: Negative for dysuria, frequency and urgency.  Musculoskeletal: Negative for arthralgias, back pain and myalgias.       R wrist pain -some in hand  Lump on palm of R hand  Skin: Negative for pallor and rash.  Allergic/Immunologic: Negative for environmental allergies.  Neurological: Negative for dizziness, syncope and headaches.  Hematological: Negative for adenopathy. Does not bruise/bleed easily.  Psychiatric/Behavioral: Negative  for decreased concentration and dysphoric mood. The patient is not nervous/anxious.        Objective:   Physical Exam Constitutional:      General: She is not in acute distress.    Appearance: Normal appearance. She is normal weight. She is not ill-appearing.  Eyes:     Extraocular Movements: Extraocular movements intact.     Conjunctiva/sclera: Conjunctivae normal.     Pupils: Pupils are equal, round, and reactive to light.  Neck:     Musculoskeletal: Normal range of motion.  Cardiovascular:     Heart sounds: Normal heart sounds.  Pulmonary:     Effort: Pulmonary effort is normal. No respiratory distress.     Breath sounds: No wheezing.  Musculoskeletal:     Comments: R hand- mild tenderness of 5th metacarpal Nl rom of fingers  Nl strength and no neuro changes   Small rubbery lump/dense area felt in palm lining up with 4th digit  Non tender and no skin changes  Firm /not mobile  R wrist Full rom Pain with flex/ext against resistance and supination  Some mild tenderness at distal ulna  No swelling/effusoin or crepitus No bruising or skin change Nl sens and strength  Lymphadenopathy:     Cervical: No cervical adenopathy.  Skin:    General: Skin is warm and dry.     Findings: Lesion present. No erythema or rash.     Comments: Faint area of previous tinea corporis area on L upper arm   2-3 cm area of pink skin with some scale in antecubital area-slightly raised  No skin cracking or drainage No central clearing   Neurological:     Mental Status: She is alert.     Cranial Nerves: No cranial nerve deficit.     Sensory: No sensory deficit.     Deep Tendon Reflexes: Reflexes normal.  Psychiatric:        Mood and Affect: Mood normal.           Assessment & Plan:   Problem List Items Addressed This Visit      Musculoskeletal and Integument   Ringworm of body    Area on L upper arm almost resolved with ketoconazole      Rash and nonspecific skin eruption     In antecubital area of L arm -not responding to ketoconazole  Suspect dermatitis/perhaps eczema  Trial of triamcinolone cream Try to avoid hot water/harsh detergents and antigens Update if not starting to improve in a week or if worsening          Other   Right wrist pain - Primary    With tenderness in distal ulna after trauma 5 mo ago  Pain with lifting /resisted flex and ext  No swelling or bruising  Relevant Orders   DG Hand Complete Right (Completed)   DG Wrist Complete Right (Completed)   Lump    Lump (nontender) felt over R palm that is non tender Firm but cannot tell if fluctuant  ? If ganglion cyst vs thickened tendon (Dupuytren's), vs other  Ref to hand specialist

## 2019-05-16 NOTE — Assessment & Plan Note (Signed)
Area on L upper arm almost resolved with ketoconazole

## 2019-05-20 NOTE — Telephone Encounter (Signed)
Appt made and patient is aware. °

## 2019-08-07 ENCOUNTER — Ambulatory Visit (INDEPENDENT_AMBULATORY_CARE_PROVIDER_SITE_OTHER): Payer: Medicare Other

## 2019-08-07 DIAGNOSIS — Z23 Encounter for immunization: Secondary | ICD-10-CM

## 2019-08-20 ENCOUNTER — Encounter: Payer: Self-pay | Admitting: Gastroenterology

## 2019-08-20 ENCOUNTER — Other Ambulatory Visit: Payer: Self-pay | Admitting: Family Medicine

## 2019-08-20 DIAGNOSIS — Z1231 Encounter for screening mammogram for malignant neoplasm of breast: Secondary | ICD-10-CM

## 2019-09-04 ENCOUNTER — Ambulatory Visit (AMBULATORY_SURGERY_CENTER): Payer: Self-pay | Admitting: *Deleted

## 2019-09-04 ENCOUNTER — Encounter: Payer: Self-pay | Admitting: Gastroenterology

## 2019-09-04 ENCOUNTER — Other Ambulatory Visit: Payer: Self-pay

## 2019-09-04 VITALS — Temp 97.1°F | Ht 65.5 in | Wt 151.2 lb

## 2019-09-04 DIAGNOSIS — Z1211 Encounter for screening for malignant neoplasm of colon: Secondary | ICD-10-CM

## 2019-09-04 MED ORDER — NA SULFATE-K SULFATE-MG SULF 17.5-3.13-1.6 GM/177ML PO SOLN: ORAL | 0 refills | Status: DC

## 2019-09-04 NOTE — Progress Notes (Signed)
Patient is here in-person for PV. Patient denies any allergies to eggs or soy. Patient denies any problems with anesthesia/sedation. Patient denies any oxygen use at home. Patient denies taking any diet/weight loss medications or blood thinners. Patient is not being treated for MRSA or C-diff. EMMI education assisgned to patient on colonoscopy, this was explained and instructions given to patient.   Pt is aware that care partner will wait in the car during procedure; if they feel like they will be too hot to wait in the car; they may wait in the lobby. Patient is aware to bring only one care partner. We want them to wear a mask (we do not have any that we can provide them), practice social distancing, and we will check their temperatures when they get here.  I did remind patient that their care partner needs to stay in the parking lot the entire time. Pt will wear mask into building.

## 2019-09-17 ENCOUNTER — Telehealth: Payer: Self-pay

## 2019-09-17 NOTE — Telephone Encounter (Signed)
Covid-19 screening questions   Do you now or have you had a fever in the last 14 days?  Do you have any respiratory symptoms of shortness of breath or cough now or in the last 14 days?  Do you have any family members or close contacts with diagnosed or suspected Covid-19 in the past 14 days?  Have you been tested for Covid-19 and found to be positive?       

## 2019-09-17 NOTE — Telephone Encounter (Signed)
Patient called back and answered "NO" to all screening questions. °

## 2019-09-18 ENCOUNTER — Encounter: Payer: Self-pay | Admitting: Gastroenterology

## 2019-09-18 ENCOUNTER — Ambulatory Visit (AMBULATORY_SURGERY_CENTER): Payer: Medicare Other | Admitting: Gastroenterology

## 2019-09-18 ENCOUNTER — Other Ambulatory Visit: Payer: Self-pay

## 2019-09-18 VITALS — BP 129/60 | HR 60 | Temp 98.6°F | Resp 22 | Ht 65.0 in | Wt 151.0 lb

## 2019-09-18 DIAGNOSIS — D122 Benign neoplasm of ascending colon: Secondary | ICD-10-CM | POA: Diagnosis not present

## 2019-09-18 DIAGNOSIS — Z1211 Encounter for screening for malignant neoplasm of colon: Secondary | ICD-10-CM

## 2019-09-18 DIAGNOSIS — D125 Benign neoplasm of sigmoid colon: Secondary | ICD-10-CM

## 2019-09-18 DIAGNOSIS — K635 Polyp of colon: Secondary | ICD-10-CM

## 2019-09-18 MED ORDER — SODIUM CHLORIDE 0.9 % IV SOLN: 500.0000 mL | Freq: Once | INTRAVENOUS | Status: DC

## 2019-09-18 NOTE — Op Note (Signed)
[facility] Patient Name: Elizabeth Duncan Procedure Date: 09/18/2019 8:01 AM MRN: [id number] Endoscopist: Mauri Pole , MD Age: 68 Referring MD:  Date of Birth: 10-05-1951 Gender: Female Account #: [id number] Procedure:                Colonoscopy Indications:              Screening for colorectal malignant neoplasm Medicines:                Monitored Anesthesia Care Procedure:                Pre-Anesthesia Assessment:                           - Prior to the procedure, a History and Physical                            was performed, and patient medications and                            allergies were reviewed. The patient's tolerance of                            previous anesthesia was also reviewed. The risks                            and benefits of the procedure and the sedation                            options and risks were discussed with the patient.                            All questions were answered, and informed consent                            was obtained. Prior Anticoagulants: The patient has                            taken no previous anticoagulant or antiplatelet                            agents. ASA Grade Assessment: II - A patient with                            mild systemic disease. After reviewing the risks                            and benefits, the patient was deemed in                            satisfactory condition to undergo the procedure.                           After obtaining informed consent, the colonoscope  was passed under direct vision. Throughout the                            procedure, the patient's blood pressure, pulse, and                            oxygen saturations were monitored continuously. The                            Colonoscope was introduced through the anus and                            advanced to the the terminal ileum, with                            identification of the  appendiceal orifice and IC                            valve. The colonoscopy was performed without                            difficulty. The patient tolerated the procedure                            well. The quality of the bowel preparation was                            adequate. The ileocecal valve, appendiceal orifice,                            and rectum were photographed. Scope In: 8:09:42 AM Scope Out: 8:31:01 AM Scope Withdrawal Time: 0 hours 15 minutes 28 seconds  Total Procedure Duration: 0 hours 21 minutes 19 seconds  Findings:                 The perianal and digital rectal examinations were                            normal.                           A 5 mm polyp was found in the ascending colon. The                            polyp was sessile. The polyp was removed with a                            cold snare. Resection and retrieval were complete.                           Two sessile polyps were found in the sigmoid colon                            and ascending colon. The polyps were 1 to 2  mm in                            size. These polyps were removed with a cold biopsy                            forceps. Resection and retrieval were complete.                           A few small and large-mouthed diverticula were                            found in the recto-sigmoid colon and sigmoid colon.                           Non-bleeding internal hemorrhoids were found during                            retroflexion. The hemorrhoids were small.                           The exam was otherwise without abnormality. Complications:            No immediate complications. Estimated Blood Loss:     Estimated blood loss was minimal. Impression:               - One 5 mm polyp in the ascending colon, removed                            with a cold snare. Resected and retrieved.                           - Two 1 to 2 mm polyps in the sigmoid colon and in                             the ascending colon, removed with a cold biopsy                            forceps. Resected and retrieved.                           - Diverticulosis in the recto-sigmoid colon and in                            the sigmoid colon.                           - Non-bleeding internal hemorrhoids.                           - The examination was otherwise normal. Recommendation:           - Patient has a contact number available for                            emergencies.  The signs and symptoms of potential                            delayed complications were discussed with the                            patient. Return to normal activities tomorrow.                            Written discharge instructions were provided to the                            patient.                           - Resume previous diet.                           - Continue present medications.                           - Await pathology results.                           - Repeat colonoscopy in 3 - 10 years for                            surveillance based on pathology results. Mauri Pole, MD 09/18/2019 8:35:57 AM This report has been signed electronically.

## 2019-09-18 NOTE — Progress Notes (Signed)
PT taken to PACU. Monitors in place. VSS. Report given to RN. 

## 2019-09-18 NOTE — Progress Notes (Signed)
Reviewed med and surg history with pt and no changes noted.

## 2019-09-18 NOTE — Progress Notes (Signed)
Ka temp  N. S vitals

## 2019-09-18 NOTE — Progress Notes (Signed)
Called to room to assist during endoscopic procedure.  Patient ID and intended procedure confirmed with present staff. Received instructions for my participation in the procedure from the performing physician.  

## 2019-09-18 NOTE — Patient Instructions (Signed)
Please read handouts provided. Continue present medications. Await pathology results.        YOU HAD AN ENDOSCOPIC PROCEDURE TODAY AT THE  ENDOSCOPY CENTER:   Refer to the procedure report that was given to you for any specific questions about what was found during the examination.  If the procedure report does not answer your questions, please call your gastroenterologist to clarify.  If you requested that your care partner not be given the details of your procedure findings, then the procedure report has been included in a sealed envelope for you to review at your convenience later.  YOU SHOULD EXPECT: Some feelings of bloating in the abdomen. Passage of more gas than usual.  Walking can help get rid of the air that was put into your GI tract during the procedure and reduce the bloating. If you had a lower endoscopy (such as a colonoscopy or flexible sigmoidoscopy) you may notice spotting of blood in your stool or on the toilet paper. If you underwent a bowel prep for your procedure, you may not have a normal bowel movement for a few days.  Please Note:  You might notice some irritation and congestion in your nose or some drainage.  This is from the oxygen used during your procedure.  There is no need for concern and it should clear up in a day or so.  SYMPTOMS TO REPORT IMMEDIATELY:   Following lower endoscopy (colonoscopy or flexible sigmoidoscopy):  Excessive amounts of blood in the stool  Significant tenderness or worsening of abdominal pains  Swelling of the abdomen that is new, acute  Fever of 100F or higher    For urgent or emergent issues, a gastroenterologist can be reached at any hour by calling [phone].   DIET:  We do recommend a small meal at first, but then you may proceed to your regular diet.  Drink plenty of fluids but you should avoid alcoholic beverages for 24 hours.  ACTIVITY:  You should plan to take it easy for the rest of today and you should NOT  DRIVE or use heavy machinery until tomorrow (because of the sedation medicines used during the test).    FOLLOW UP: Our staff will call the number listed on your records 48-72 hours following your procedure to check on you and address any questions or concerns that you may have regarding the information given to you following your procedure. If we do not reach you, we will leave a message.  We will attempt to reach you two times.  During this call, we will ask if you have developed any symptoms of COVID 19. If you develop any symptoms (ie: fever, flu-like symptoms, shortness of breath, cough etc.) before then, please call [phone].  If you test positive for Covid 19 in the 2 weeks post procedure, please call and report this information to us.    If any biopsies were taken you will be contacted by phone or by letter within the next 1-3 weeks.  Please call us at [phone] if you have not heard about the biopsies in 3 weeks.    SIGNATURES/CONFIDENTIALITY: You and/or your care partner have signed paperwork which will be entered into your electronic medical record.  These signatures attest to the fact that that the information above on your After Visit Summary has been reviewed and is understood.  Full responsibility of the confidentiality of this discharge information lies with you and/or your care-partner. 

## 2019-09-20 ENCOUNTER — Telehealth: Payer: Self-pay

## 2019-09-20 ENCOUNTER — Telehealth: Payer: Self-pay | Admitting: *Deleted

## 2019-09-20 NOTE — Telephone Encounter (Signed)
Left message on answering machine. 

## 2019-09-20 NOTE — Telephone Encounter (Signed)
First attempt, left VM.  

## 2019-09-26 ENCOUNTER — Encounter: Payer: Self-pay | Admitting: Gastroenterology

## 2019-09-30 ENCOUNTER — Other Ambulatory Visit: Payer: Self-pay

## 2019-09-30 ENCOUNTER — Ambulatory Visit
Admission: RE | Admit: 2019-09-30 | Discharge: 2019-09-30 | Disposition: A | Discharge status: Home / Self Care | Payer: Medicare Other | Source: Ambulatory Visit | Attending: Family Medicine | Admitting: Family Medicine

## 2019-09-30 DIAGNOSIS — Z1231 Encounter for screening mammogram for malignant neoplasm of breast: Secondary | ICD-10-CM

## 2019-10-11 ENCOUNTER — Telehealth: Payer: Self-pay

## 2019-10-11 DIAGNOSIS — B354 Tinea corporis: Secondary | ICD-10-CM

## 2019-10-11 DIAGNOSIS — R21 Rash and other nonspecific skin eruption: Secondary | ICD-10-CM

## 2019-10-11 NOTE — Telephone Encounter (Signed)
Patient saw you in June for an rash on her left arm, and she states she has been using the cream that you gave her,  as well as following your other recommendations - but it seems to be getting worse. Patient is wondering if she needs to come back in and see you for this, or if you could place a referral to dermatology?

## 2019-10-11 NOTE — Telephone Encounter (Signed)
Left VM letting pt know referral done and [facility] will call next week to schedule appt

## 2019-10-11 NOTE — Telephone Encounter (Signed)
I placed a derm ref The office will call

## 2019-10-11 NOTE — Addendum Note (Signed)
Addended by: Loura Pardon A on: 10/11/2019 04:56 PM   Modules accepted: Orders

## 2019-10-14 NOTE — Telephone Encounter (Signed)
Left message for patient to call Rosaria Ferries back at [phone] regarding the Dermatology Referral

## 2019-11-04 ENCOUNTER — Encounter: Payer: Self-pay | Admitting: Family Medicine

## 2019-11-04 ENCOUNTER — Other Ambulatory Visit: Payer: Self-pay

## 2019-11-04 ENCOUNTER — Ambulatory Visit (INDEPENDENT_AMBULATORY_CARE_PROVIDER_SITE_OTHER): Payer: Medicare Other | Admitting: Family Medicine

## 2019-11-04 VITALS — BP 122/76 | HR 61 | Temp 96.1°F | Ht 65.5 in | Wt 149.0 lb

## 2019-11-04 DIAGNOSIS — Z0000 Encounter for general adult medical examination without abnormal findings: Secondary | ICD-10-CM | POA: Diagnosis not present

## 2019-11-04 LAB — COMPREHENSIVE METABOLIC PANEL
ALT: 14 U/L (ref 0–35)
AST: 21 U/L (ref 0–37)
Albumin: 3.7 g/dL (ref 3.5–5.2)
Alkaline Phosphatase: 51 U/L (ref 39–117)
BUN: 15 mg/dL (ref 6–23)
CO2: 27 mEq/L (ref 19–32)
Calcium: 8.9 mg/dL (ref 8.4–10.5)
Chloride: 107 mEq/L (ref 96–112)
Creatinine, Ser: 0.65 mg/dL (ref 0.40–1.20)
GFR: 90.68 mL/min (ref >60.00)
Glucose, Bld: 85 mg/dL (ref 70–99)
Potassium: 5.3 mEq/L — ABNORMAL HIGH (ref 3.5–5.1)
Sodium: 140 mEq/L (ref 135–145)
Total Bilirubin: 0.5 mg/dL (ref 0.2–1.2)
Total Protein: 6.3 g/dL (ref 6.0–8.3)

## 2019-11-04 LAB — TSH: TSH: 1.88 u[IU]/mL (ref 0.35–4.50)

## 2019-11-04 LAB — CBC WITH DIFFERENTIAL/PLATELET
Basophils Absolute: 0 10*3/uL (ref 0.0–0.1)
Basophils Relative: 1 % (ref 0.0–3.0)
Eosinophils Absolute: 0.1 10*3/uL (ref 0.0–0.7)
Eosinophils Relative: 2.2 % (ref 0.0–5.0)
HCT: 39.9 % (ref 36.0–46.0)
Hemoglobin: 13.2 g/dL (ref 12.0–15.0)
Lymphocytes Relative: 32.3 % (ref 12.0–46.0)
Lymphs Abs: 1.2 10*3/uL (ref 0.7–4.0)
MCHC: 33.1 g/dL (ref 30.0–36.0)
MCV: 95.8 fl (ref 78.0–100.0)
Monocytes Absolute: 0.3 10*3/uL (ref 0.1–1.0)
Monocytes Relative: 6.6 % (ref 3.0–12.0)
Neutro Abs: 2.2 10*3/uL (ref 1.4–7.7)
Neutrophils Relative %: 57.9 % (ref 43.0–77.0)
Platelets: 229 10*3/uL (ref 150.0–400.0)
RBC: 4.16 Mil/uL (ref 3.87–5.11)
RDW: 13.9 % (ref 11.5–15.5)
WBC: 3.9 10*3/uL — ABNORMAL LOW (ref 4.0–10.5)

## 2019-11-04 LAB — LIPID PANEL
Cholesterol: 219 mg/dL — ABNORMAL HIGH (ref 0–200)
HDL: 67.8 mg/dL (ref >39.00)
LDL Cholesterol: 136 mg/dL — ABNORMAL HIGH (ref 0–99)
NonHDL: 151.4
Total CHOL/HDL Ratio: 3
Triglycerides: 77 mg/dL (ref 0.0–149.0)
VLDL: 15.4 mg/dL (ref 0.0–40.0)

## 2019-11-04 NOTE — Progress Notes (Signed)
Subjective:    Patient ID: Elizabeth Duncan, female    DOB: Oct 15, 1951, 68 y.o.   MRN: [id number]  This visit occurred during the SARS-CoV-2 public health emergency.  Safety protocols were in place, including screening questions prior to the visit, additional usage of staff PPE, and extensive cleaning of exam room while observing appropriate contact time as indicated for disinfecting solutions.    HPI Here for amw and health mt exam with rev of chronic health problems   I have personally reviewed the Medicare Annual Wellness questionnaire and have noted 1. The patient's medical and social history 2. Their use of alcohol, tobacco or illicit drugs 3. Their current medications and supplements 4. The patient's functional ability including ADL's, fall risks, home safety risks and hearing or visual             impairment. 5. Diet and physical activities 6. Evidence for depression or mood disorders  The patients weight, height, BMI have been recorded in the chart and visual acuity is per eye clinic.  I have made referrals, counseling and provided education to the patient based review of the above and I have provided the pt with a written personalized care plan for preventive services. Reviewed and updated provider list, see scanned forms.  See scanned forms.  Routine anticipatory guidance given to patient.  See health maintenance. Colon cancer screening  10/20 colonoscopy with 7 y recall  Breast cancer screening  Mammogram 10/20  Self breast exam-no lumps  Flu vaccine 8/20  Tetanus vaccine Td 8/08 Pneumovax completed  Zoster vaccine  zostavax 12/16 Dexa 3/18-normal BMD Falls- none  Fractures -none  Supplements- not taking D or Ca Outdoors a lot  Exercise - 6 days per week = weights / cardio and walks with a friend   Advance directive-has utd living will and poa (just did it) Cognitive function addressed- see scanned forms- and if abnormal then additional documentation follows.   No memory concerns overall -occ misplaces things  Able to handle her own affairs   PMH and SH reviewed  Meds, vitals, and allergies reviewed.   ROS: See HPI.  Otherwise negative.    Doing great overall  Saw dermatology for her rash- given steroids  (did not know what it was but guessed eczema)  Taking care of her skin    Weight : Wt Readings from Last 3 Encounters:  11/04/19 149 lb (67.6 kg)  09/18/19 151 lb (68.5 kg)  09/04/19 151 lb 3.2 oz (68.6 kg)  exercising and eating well  24.42 kg/m    Hearing/vision:  Hearing Screening   125Hz  250Hz  500Hz  1000Hz  2000Hz  3000Hz  4000Hz  6000Hz  8000Hz   Right ear:   40 40 40  40    Left ear:   40 40 40  40    Vision Screening Comments: Pt had eye exam at [facility] in Oct 2020 no new vision problems   BP BP Readings from Last 3 Encounters:  11/04/19 122/76  09/18/19 129/60  05/16/19 128/74    Pulse Pulse Readings from Last 3 Encounters:  11/04/19 61  09/18/19 60  05/16/19 60    Due for labs today Is eating healthy = except for dessert   Patient Active Problem List   Diagnosis Date Noted  . Medicare annual wellness visit, subsequent 11/04/2019  . Lump 05/16/2019  . Estrogen deficiency 02/10/2017  . Routine general medical examination at a health care facility 01/21/2013  . Stress incontinence in female 01/21/2013  . INTENTION TREMOR  07/17/2007   Past Medical History:  Diagnosis Date  . Congenital vascular malformation    hemangiomas being treated with laser surgery by Azzie Roup (Dr. Kalman Shan)  . History of anemia   . History of diverticulosis   . History of pelvic mass   . History of urinary incontinence   . Intention tremor    Past Surgical History:  Procedure Laterality Date  . bone spur  2008   Rt 5th toe removed  . CARDIOVASCULAR STRESS TEST  2005   normal stress test per pt  . COLONOSCOPY  04/17/2009  . ESOPHAGOGASTRODUODENOSCOPY     gastritis  . EXCISION PARTIAL PHALANX Left 03/16/2017    Procedure: Part excision bone phalanx of left 5th;  Surgeon: Albertine Patricia, DPM;  Location: [place];  Service: Podiatry;  Laterality: Left;  IVA local  . laser treatment of birth mark     @ [facility] (right leg)   Social History   Tobacco Use  . Smoking status: Never Smoker  . Smokeless tobacco: Never Used  Substance Use Topics  . Alcohol use: No    Alcohol/week: 0.0 standard drinks  . Drug use: No   Family History  Problem Relation Age of Onset  . Alzheimer's disease Mother   . Stroke Father        x2  . Heart failure Father   . Kidney Stones Brother   . Diabetes Brother   . Bipolar disorder Sister   . Colon polyps Sister   . Cancer Maternal Grandmother        breast cancer  . Breast cancer Maternal Grandfather   . Colon cancer Neg Hx   . Esophageal cancer Neg Hx   . Rectal cancer Neg Hx   . Stomach cancer Neg Hx    Allergies  Allergen Reactions  . Amoxicillin Rash        Current Outpatient Medications on File Prior to Visit  Medication Sig Dispense Refill  . augmented betamethasone dipropionate (DIPROLENE-AF) 0.05 % cream APPLY CREAM TOPICALLY TO AFFECTED AREA UP TO TWICE DAILY AS NEEDED. DO NOT APPLY TO THE FACE GROIN OR UNDERARMS     No current facility-administered medications on file prior to visit.      Review of Systems  Constitutional: Negative for activity change, appetite change, fatigue, fever and unexpected weight change.  HENT: Negative for congestion, ear pain, rhinorrhea, sinus pressure and sore throat.   Eyes: Negative for pain, redness and visual disturbance.  Respiratory: Negative for cough, shortness of breath and wheezing.   Cardiovascular: Negative for chest pain and palpitations.  Gastrointestinal: Negative for abdominal pain, blood in stool, constipation and diarrhea.  Endocrine: Negative for polydipsia and polyuria.  Genitourinary: Negative for dysuria, frequency and urgency.       Bladder prolapse Does not bother her a lot   Does kegel exercises   Musculoskeletal: Negative for arthralgias, back pain and myalgias.  Skin: Negative for pallor and rash.  Allergic/Immunologic: Negative for environmental allergies.  Neurological: Negative for dizziness, syncope and headaches.  Hematological: Negative for adenopathy. Does not bruise/bleed easily.  Psychiatric/Behavioral: Negative for decreased concentration and dysphoric mood. The patient is not nervous/anxious.        Objective:   Physical Exam Constitutional:      General: She is not in acute distress.    Appearance: Normal appearance. She is well-developed and normal weight. She is not ill-appearing or diaphoretic.  HENT:     Head: Normocephalic and atraumatic.  Right Ear: Tympanic membrane, ear canal and external ear normal.     Left Ear: Tympanic membrane, ear canal and external ear normal.     Nose: Nose normal. No congestion.     Mouth/Throat:     Mouth: Mucous membranes are moist.     Pharynx: Oropharynx is clear. No posterior oropharyngeal erythema.  Eyes:     General: No scleral icterus.    Extraocular Movements: Extraocular movements intact.     Conjunctiva/sclera: Conjunctivae normal.     Pupils: Pupils are equal, round, and reactive to light.  Neck:     Musculoskeletal: Normal range of motion and neck supple. No neck rigidity or muscular tenderness.     Thyroid: No thyromegaly.     Vascular: No carotid bruit or JVD.  Cardiovascular:     Rate and Rhythm: Normal rate and regular rhythm.     Pulses: Normal pulses.     Heart sounds: Normal heart sounds. No gallop.   Pulmonary:     Effort: Pulmonary effort is normal. No respiratory distress.     Breath sounds: Normal breath sounds. No wheezing.     Comments: Good air exch Chest:     Chest wall: No tenderness.  Abdominal:     General: Bowel sounds are normal. There is no distension or abdominal bruit.     Palpations: Abdomen is soft. There is no mass.     Tenderness: There is no abdominal  tenderness.     Hernia: No hernia is present.  Genitourinary:    Comments: Breast exam: No mass, nodules, thickening, tenderness, bulging, retraction, inflamation, nipple discharge or skin changes noted.  No axillary or clavicular LA.     Musculoskeletal: Normal range of motion.        General: No tenderness.     Right lower leg: No edema.     Left lower leg: No edema.  Lymphadenopathy:     Cervical: No cervical adenopathy.  Skin:    General: Skin is warm and dry.     Coloration: Skin is not pale.     Findings: No erythema or rash.     Comments: Solar lentigines diffusely   Neurological:     Mental Status: She is alert. Mental status is at baseline.     Cranial Nerves: No cranial nerve deficit.     Motor: No abnormal muscle tone.     Coordination: Coordination normal.     Gait: Gait normal.     Deep Tendon Reflexes: Reflexes are normal and symmetric. Reflexes normal.  Psychiatric:        Mood and Affect: Mood normal.        Cognition and Memory: Cognition and memory normal.     Comments: Pleasant            Assessment & Plan:   Problem List Items Addressed This Visit      Other   Routine general medical examination at a health care facility    Reviewed health habits including diet and exercise and skin cancer prevention Reviewed appropriate screening tests for age  Also reviewed health mt list, fam hx and immunization status , as well as social and family history   See HPI Labs ordered  Commended good health habits/fitness utd derm care  Disc shingrix vaccine-pt may check on coverage  Enc pt to take regular ca and D for bone health   Will return for tetanus shot if she gets a wound  Advance directive is up to date  No cognitive concerns  Nl hearing screen  utd vision and eye care w/o complaints  No falls or fx -does exercise for balance         Relevant Orders   CBC w/Diff   Lipid panel   TSH   Comprehensive metabolic panel   Medicare annual wellness  visit, subsequent - Primary    Reviewed health habits including diet and exercise and skin cancer prevention Reviewed appropriate screening tests for age  Also reviewed health mt list, fam hx and immunization status , as well as social and family history   See HPI Labs ordered  Commended good health habits/fitness utd derm care  Disc shingrix vaccine-pt may check on coverage  Enc pt to take regular ca and D for bone health   Will return for tetanus shot if she gets a wound  Advance directive is up to date  No cognitive concerns  Nl hearing screen  utd vision and eye care w/o complaints  No falls or fx -does exercise for balance

## 2019-11-04 NOTE — Assessment & Plan Note (Signed)
Reviewed health habits including diet and exercise and skin cancer prevention Reviewed appropriate screening tests for age  Also reviewed health mt list, fam hx and immunization status , as well as social and family history   See HPI Labs ordered  Commended good health habits/fitness utd derm care  Disc shingrix vaccine-pt may check on coverage  Enc pt to take regular ca and D for bone health   Will return for tetanus shot if she gets a wound  Advance directive is up to date  No cognitive concerns  Nl hearing screen  utd vision and eye care w/o complaints  No falls or fx -does exercise for balance

## 2019-11-04 NOTE — Patient Instructions (Addendum)
If you are interested in the new shingles vaccine (Shingrix) - call your local pharmacy to check on coverage and availability  If affordable, get on a wait list at your pharmacy to get the vaccine.  Try to get 1200-1500 mg of calcium per day with at least 1000 iu of vitamin D - for bone health   Wellness labs today

## 2020-01-23 ENCOUNTER — Ambulatory Visit: Payer: Medicare PPO | Attending: Internal Medicine

## 2020-01-23 DIAGNOSIS — Z23 Encounter for immunization: Secondary | ICD-10-CM | POA: Insufficient documentation

## 2020-01-23 NOTE — Progress Notes (Signed)
   Covid-19 Vaccination Clinic  Name:  Elizabeth Duncan    MRN: [id number] DOB: [birth year]  01/23/2020  Elizabeth Duncan was observed post Covid-19 immunization for 15 minutes without incidence. She was provided with Vaccine Information Sheet and instruction to access the V-Safe system.   Elizabeth Duncan was instructed to call 911 with any severe reactions post vaccine: Marland Kitchen Difficulty breathing  . Swelling of your face and throat  . A fast heartbeat  . A bad rash all over your body  . Dizziness and weakness    Immunizations Administered    Name Date Dose VIS Date Route   Pfizer COVID-19 Vaccine 01/23/2020  8:40 AM 0.3 mL 11/22/2019 Intramuscular   Manufacturer: [place]   Lot: [id number]   March ARB: [id number]

## 2020-01-24 ENCOUNTER — Ambulatory Visit: Payer: Medicare PPO

## 2020-02-18 ENCOUNTER — Ambulatory Visit: Payer: Medicare PPO | Attending: Internal Medicine

## 2020-02-18 DIAGNOSIS — Z23 Encounter for immunization: Secondary | ICD-10-CM | POA: Insufficient documentation

## 2020-02-18 NOTE — Progress Notes (Signed)
   Covid-19 Vaccination Clinic  Name:  Elizabeth Duncan    MRN: [id number] DOB: 1951/04/26  02/18/2020  [name] was observed post Covid-19 immunization for 15 minutes without incident. She was provided with Vaccine Information Sheet and instruction to access the V-Safe system.   [name] was instructed to call 911 with any severe reactions post vaccine: Marland Kitchen Difficulty breathing  . Swelling of face and throat  . A fast heartbeat  . A bad rash all over body  . Dizziness and weakness   Immunizations Administered    Name Date Dose VIS Date Route   Pfizer COVID-19 Vaccine 02/18/2020 12:44 PM 0.3 mL 11/22/2019 Intramuscular   Manufacturer: [place]   Lot: [id number]   [place]: [id number]

## 2020-07-20 ENCOUNTER — Encounter: Payer: Self-pay | Admitting: Family Medicine

## 2020-07-20 ENCOUNTER — Telehealth (INDEPENDENT_AMBULATORY_CARE_PROVIDER_SITE_OTHER): Payer: Medicare PPO | Admitting: Family Medicine

## 2020-07-20 DIAGNOSIS — U071 COVID-19: Secondary | ICD-10-CM | POA: Insufficient documentation

## 2020-07-20 NOTE — Assessment & Plan Note (Signed)
Pt thinks she has had this for 2 weeks or so (tested neg originally and then pos on sat) Whole family exposed on at trip  Feeling much better now  Breathing is fine- occ takes a deep breath but no sob on exertion  Enc to drink fluids and rest  tx symptoms-analgesic for headache  Continue to isolate until symptoms are gone  Not a candidate for antibody infusion due to length of illness (over 10 d)  Expect her to do well but if worse inst to call/seek care Is immunized as well

## 2020-07-20 NOTE — Progress Notes (Signed)
Virtual Visit via Telephone Note  I connected with Elizabeth Duncan on 07/20/20 at  8:30 AM EDT by telephone and verified that I am speaking with the correct person using two identifiers.  Location: Patient: home Provider: office   I discussed the limitations, risks, security and privacy concerns of performing an evaluation and management service by telephone and the availability of in person appointments. I also discussed with the patient that there may be a patient responsible charge related to this service. The patient expressed understanding and agreed to proceed.  Parties involved in encounter  Patient: Elizabeth Duncan   Provider:  Loura Pardon MD     History of Present Illness: Pt presents for new dx of covid 19 with symptoms  Home test was positive on sat 07/18/20   No fever  Is tired /lethargic  occ has to take a deep breath (not panting or sob on exertion) No coughing   She had a cold (from grandson)= and got over that ? -thought that her current symptoms were a carry over from that  Tested neg for that   No loss of taste or smell  Decreased appetite  No dizziness  No body aches or chills   Drinking lots of fluids    Temp: 97.8 F (36.6 C)  Today  Her husband was diagnosed on 7/28  He had an antibody infusion and did great   She had pfizer vaccine in march  Patient Active Problem List   Diagnosis Date Noted   COVID-19 07/20/2020   Medicare annual wellness visit, subsequent 11/04/2019   Lump 05/16/2019   Estrogen deficiency 02/10/2017   Routine general medical examination at a health care facility 01/21/2013   Stress incontinence in female 01/21/2013   INTENTION TREMOR 07/17/2007   Past Medical History:  Diagnosis Date   Congenital vascular malformation    hemangiomas being treated with laser surgery by Azzie Roup (Dr. Kalman Shan)   History of anemia    History of diverticulosis    History of pelvic mass    History of urinary  incontinence    Intention tremor    Past Surgical History:  Procedure Laterality Date   bone spur  2008   Rt 5th toe removed   CARDIOVASCULAR STRESS TEST  2005   normal stress test per pt   COLONOSCOPY  04/17/2009   ESOPHAGOGASTRODUODENOSCOPY     gastritis   EXCISION PARTIAL PHALANX Left 03/16/2017   Procedure: Part excision bone phalanx of left 5th;  Surgeon: Albertine Patricia, DPM;  Location: [facility];  Service: Podiatry;  Laterality: Left;  IVA local   laser treatment of birth mark     @ [facility] (right leg)   Social History   Tobacco Use   Smoking status: Never Smoker   Smokeless tobacco: Never Used  Vaping Use   Vaping Use: Never used  Substance Use Topics   Alcohol use: No    Alcohol/week: 0.0 standard drinks   Drug use: No   Family History  Problem Relation Age of Onset   Alzheimer's disease Mother    Stroke Father        x2   Heart failure Father    Kidney Stones Brother    Diabetes Brother    Bipolar disorder Sister    Colon polyps Sister    Cancer Maternal Grandmother        breast cancer   Breast cancer Maternal Grandfather    Colon cancer Neg Hx    Esophageal cancer  Neg Hx    Rectal cancer Neg Hx    Stomach cancer Neg Hx    Allergies  Allergen Reactions   Amoxicillin Rash        No current outpatient medications on file prior to visit.   No current facility-administered medications on file prior to visit.      Review of Systems  Constitutional: Positive for malaise/fatigue. Negative for chills and fever.  HENT: Negative for congestion, ear pain, sinus pain and sore throat.   Eyes: Negative for blurred vision, discharge and redness.  Respiratory: Negative for cough, shortness of breath, wheezing and stridor.        Occ needs to take a fast or deep breath  Cardiovascular: Negative for chest pain, palpitations and leg swelling.  Gastrointestinal: Negative for abdominal pain, diarrhea, nausea and vomiting.   Musculoskeletal: Negative for myalgias.  Skin: Negative for rash.  Neurological: Positive for headaches. Negative for dizziness.    Observations/Objective: Pt sounds well-not distressed Like her usual self  Good mood  Nl cognition/good historian  No cough or sob or wheeze audible  Does not clear throat Not hoarse   Assessment and Plan: Problem List Items Addressed This Visit      Other   COVID-19    Pt thinks she has had this for 2 weeks or so (tested neg originally and then pos on sat) Whole family exposed on at trip  Feeling much better now  Breathing is fine- occ takes a deep breath but no sob on exertion  Enc to drink fluids and rest  tx symptoms-analgesic for headache  Continue to isolate until symptoms are gone  Not a candidate for antibody infusion due to length of illness (over 10 d)  Expect her to do well but if worse inst to call/seek care Is immunized as well          Follow Up Instructions: Rest and drink fluids Treat headache with otc medicines if needed  Isolate until symptoms are better  Please call if anything changes or worsens   I discussed the assessment and treatment plan with the patient. The patient was provided an opportunity to ask questions and all were answered. The patient agreed with the plan and demonstrated an understanding of the instructions.   The patient was advised to call back or seek an in-person evaluation if the symptoms worsen or if the condition fails to improve as anticipated.  I provided 17  minutes of non-face-to-face time during this encounter.   Loura Pardon, MD

## 2020-07-20 NOTE — Patient Instructions (Signed)
Rest and drink fluids Treat headache with otc medicines if needed  Isolate until symptoms are better  Please call if anything changes or worsens

## 2020-07-23 IMAGING — MG DIGITAL SCREENING BILAT W/ TOMO W/ CAD
8 series · 8 of 24 positions shown · non-contrast
Comparison: Previous exam(s).

CLINICAL DATA: Screening.

EXAM:
DIGITAL SCREENING BILATERAL MAMMOGRAM WITH TOMO AND CAD

[R MLO synth-2D]
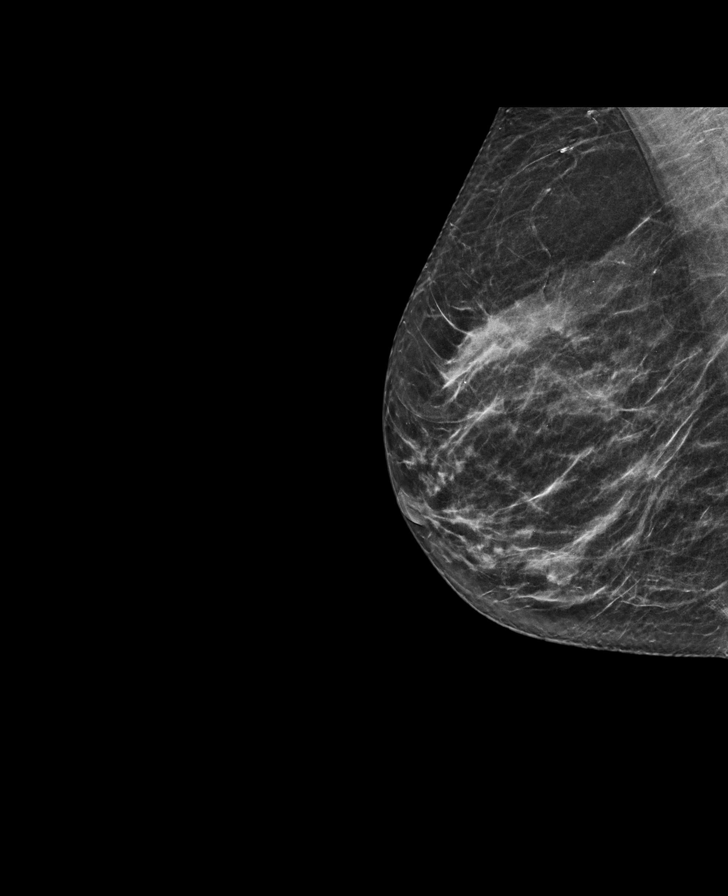

[L MLO synth-2D]
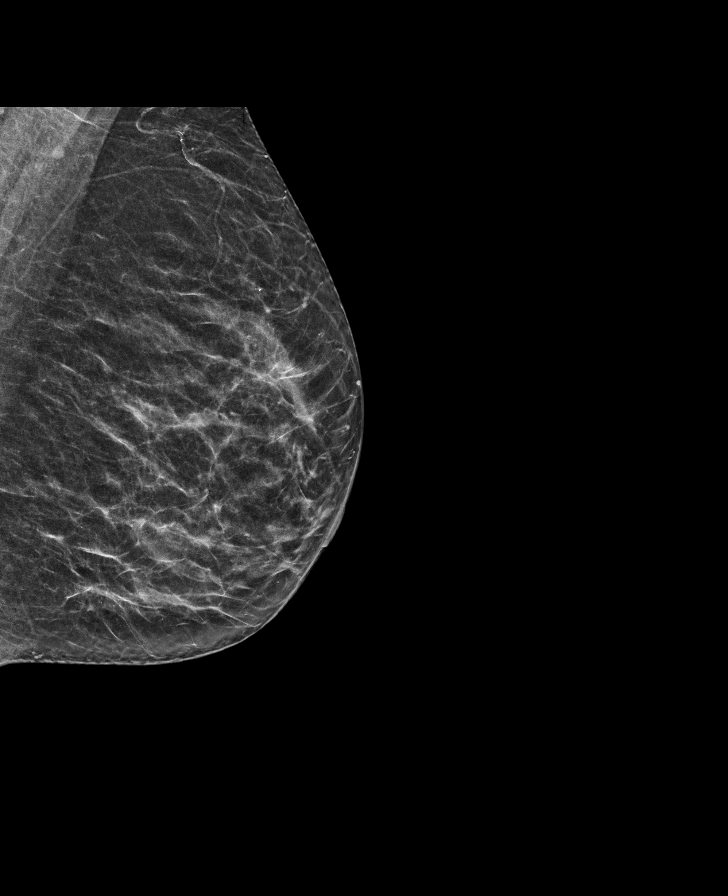

[L CC synth-2D]
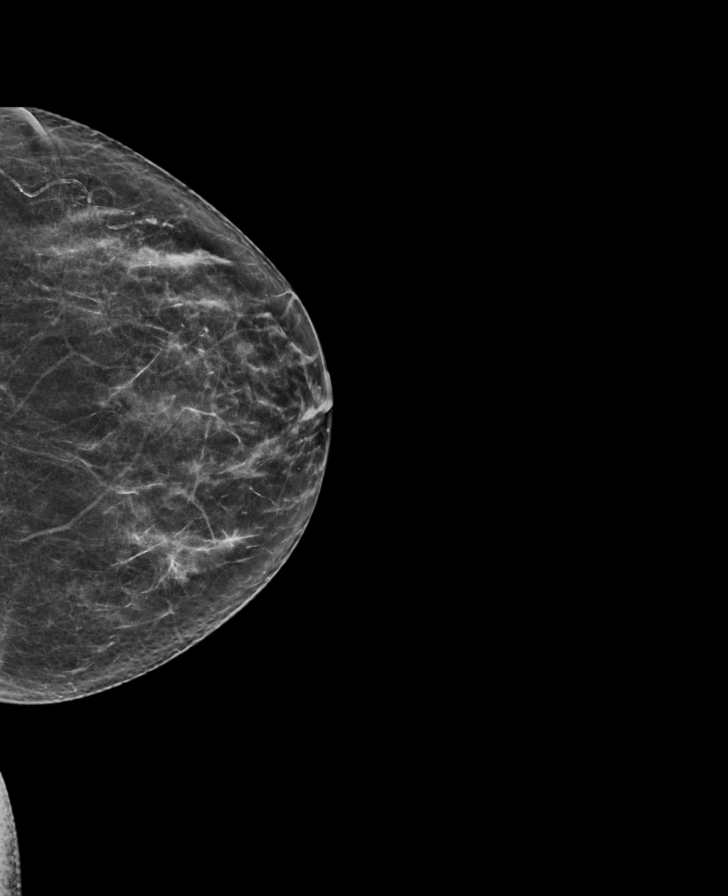

[R CC synth-2D]
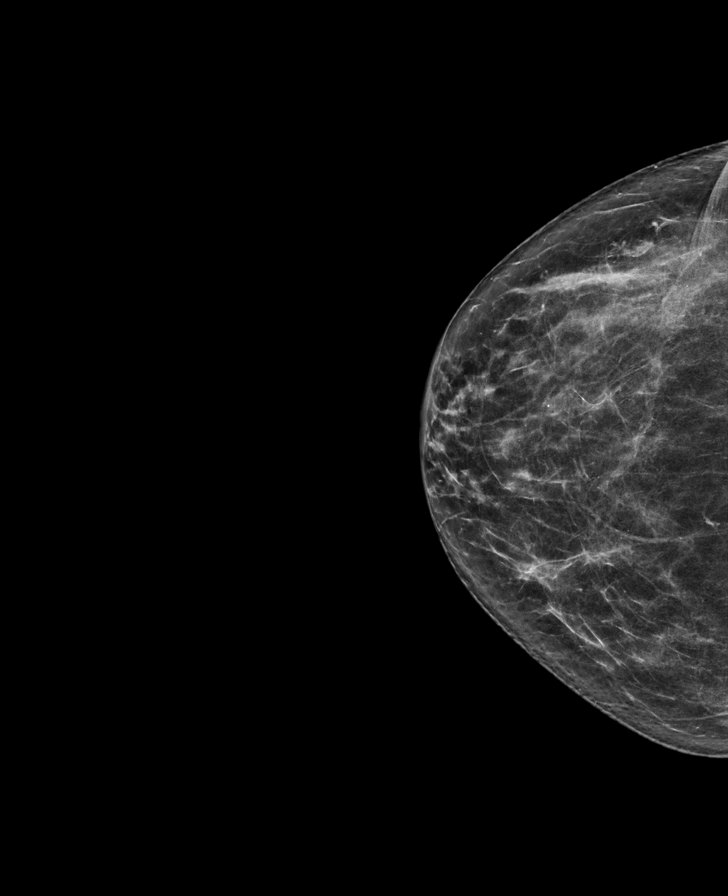

[R MLO tomo · tomo slice 32/63.0]
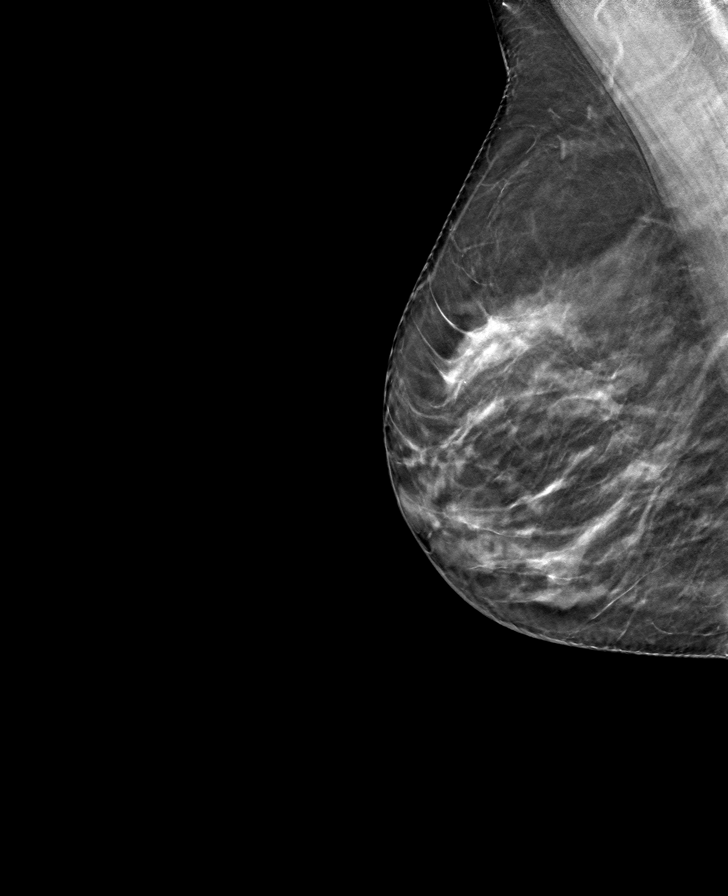

[L CC tomo · tomo slice 33/64.0]
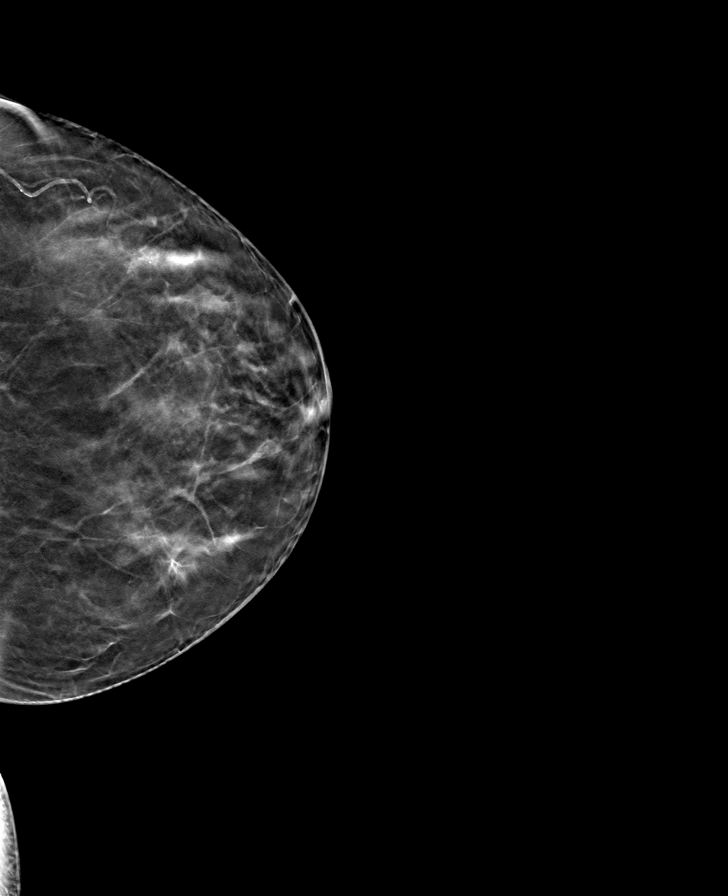

[L MLO tomo · tomo slice 31/60.0]
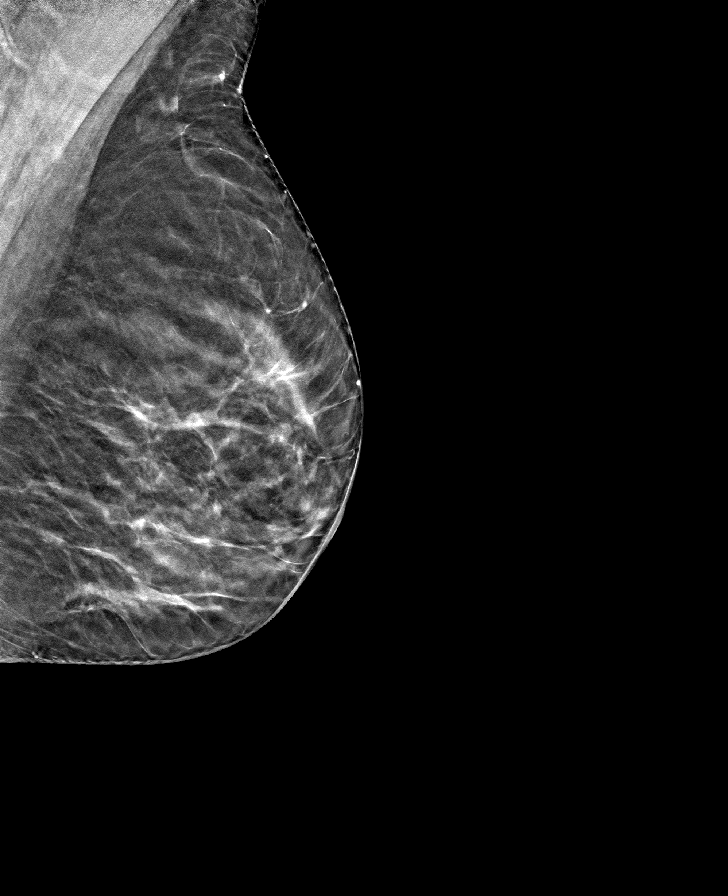

[R CC tomo · tomo slice 33/65.0]
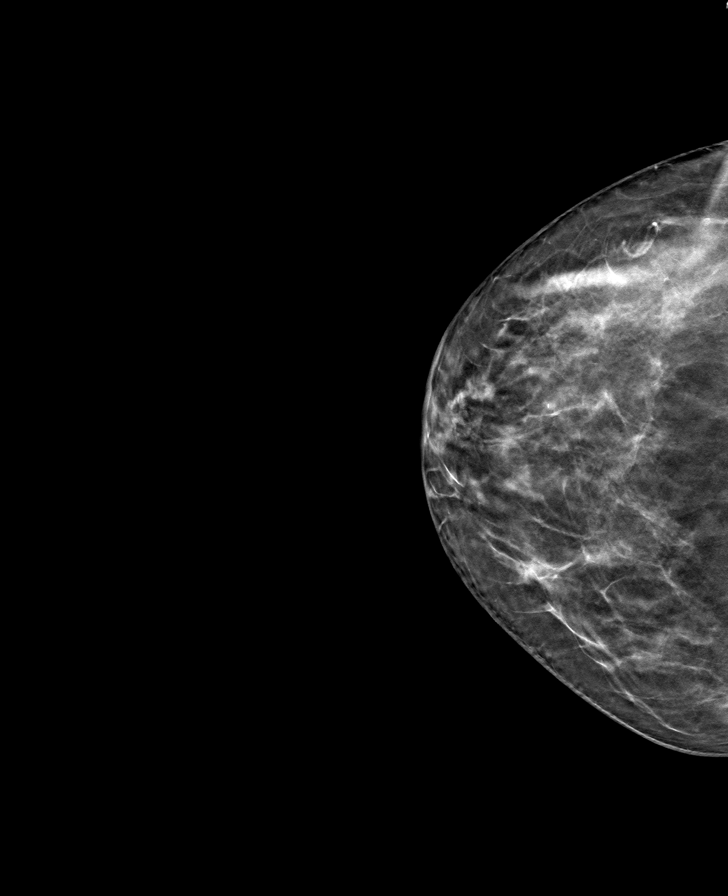

[8 of 24 positions shown; findings below may reference images not displayed]

ACR Breast Density Category c: The breast tissue is heterogeneously
dense, which may obscure small masses.
FINDINGS: There are no findings suspicious for malignancy. Images were
processed with CAD.
IMPRESSION: No mammographic evidence of malignancy. A result letter of this
screening mammogram will be mailed directly to the patient.

RECOMMENDATION:
Screening mammogram in one year. (Code:[id number])

BI-RADS CATEGORY  1: Negative.

## 2020-08-06 DIAGNOSIS — Z20822 Contact with and (suspected) exposure to covid-19: Secondary | ICD-10-CM | POA: Diagnosis not present

## 2020-08-06 DIAGNOSIS — Z03818 Encounter for observation for suspected exposure to other biological agents ruled out: Secondary | ICD-10-CM | POA: Diagnosis not present

## 2020-09-23 ENCOUNTER — Other Ambulatory Visit: Payer: Self-pay | Admitting: Family Medicine

## 2020-09-23 DIAGNOSIS — Z1231 Encounter for screening mammogram for malignant neoplasm of breast: Secondary | ICD-10-CM

## 2020-10-01 ENCOUNTER — Ambulatory Visit: Payer: Medicare PPO

## 2020-10-05 DIAGNOSIS — R030 Elevated blood-pressure reading, without diagnosis of hypertension: Secondary | ICD-10-CM | POA: Diagnosis not present

## 2020-10-05 DIAGNOSIS — Z833 Family history of diabetes mellitus: Secondary | ICD-10-CM | POA: Diagnosis not present

## 2020-10-05 DIAGNOSIS — R32 Unspecified urinary incontinence: Secondary | ICD-10-CM | POA: Diagnosis not present

## 2020-10-05 DIAGNOSIS — Z880 Allergy status to penicillin: Secondary | ICD-10-CM | POA: Diagnosis not present

## 2020-10-05 DIAGNOSIS — Z8249 Family history of ischemic heart disease and other diseases of the circulatory system: Secondary | ICD-10-CM | POA: Diagnosis not present

## 2020-10-05 DIAGNOSIS — Z818 Family history of other mental and behavioral disorders: Secondary | ICD-10-CM | POA: Diagnosis not present

## 2020-10-05 DIAGNOSIS — M1990 Unspecified osteoarthritis, unspecified site: Secondary | ICD-10-CM | POA: Diagnosis not present

## 2020-10-08 ENCOUNTER — Ambulatory Visit (INDEPENDENT_AMBULATORY_CARE_PROVIDER_SITE_OTHER): Payer: Medicare PPO

## 2020-10-08 DIAGNOSIS — Z23 Encounter for immunization: Secondary | ICD-10-CM

## 2020-10-20 ENCOUNTER — Ambulatory Visit
Admission: RE | Admit: 2020-10-20 | Discharge: 2020-10-20 | Disposition: A | Discharge status: Home / Self Care | Payer: Medicare PPO | Source: Ambulatory Visit | Attending: Family Medicine | Admitting: Family Medicine

## 2020-10-20 ENCOUNTER — Other Ambulatory Visit: Payer: Self-pay

## 2020-10-20 DIAGNOSIS — Z1231 Encounter for screening mammogram for malignant neoplasm of breast: Secondary | ICD-10-CM | POA: Diagnosis not present

## 2020-10-28 ENCOUNTER — Telehealth: Payer: Self-pay | Admitting: *Deleted

## 2020-10-28 NOTE — Telephone Encounter (Signed)
Patient called stating that she had had her 2nd covid vaccine in March. Patient stated that she had covid in August. Patient is uncertain if she needs to get the booster and if so if there is a time frame she should get the booster.  Patient requested a call back with this information.

## 2020-10-28 NOTE — Telephone Encounter (Signed)
If she had an antibody infusion-has to wait for 90 days from that date.  Otherwise - good to get a booster now

## 2020-10-29 NOTE — Telephone Encounter (Signed)
Pt notified of Dr. Marliss Coots comments and verbalized understanding. She did not have the antibody infusion so advise pt she can get booster whenever she wants

## 2020-11-02 DIAGNOSIS — Z20828 Contact with and (suspected) exposure to other viral communicable diseases: Secondary | ICD-10-CM | POA: Diagnosis not present

## 2021-03-31 ENCOUNTER — Telehealth: Payer: Self-pay | Admitting: Family Medicine

## 2021-03-31 DIAGNOSIS — Z0000 Encounter for general adult medical examination without abnormal findings: Secondary | ICD-10-CM

## 2021-03-31 NOTE — Telephone Encounter (Signed)
-----   Message from Pleas Koch, NP sent at 03/29/2021  2:23 PM EDT ----- Regarding: FW: Lab orders for Thursday, 4.21.22  ----- Message ----- From: Ellamae Sia Sent: 03/29/2021   9:19 AM EDT To: Pleas Koch, NP Subject: Lab orders for Thursday, 4.21.22               Patient is scheduled for CPX labs, please order future labs, Thanks , Elizabeth Duncan    for Dr Glori Bickers

## 2021-04-01 ENCOUNTER — Other Ambulatory Visit: Payer: Medicare PPO

## 2021-04-08 ENCOUNTER — Encounter: Payer: Medicare PPO | Admitting: Family Medicine

## 2021-05-11 DIAGNOSIS — J209 Acute bronchitis, unspecified: Secondary | ICD-10-CM | POA: Diagnosis not present

## 2021-05-14 DIAGNOSIS — L818 Other specified disorders of pigmentation: Secondary | ICD-10-CM | POA: Diagnosis not present

## 2021-05-14 DIAGNOSIS — L814 Other melanin hyperpigmentation: Secondary | ICD-10-CM | POA: Diagnosis not present

## 2021-05-14 DIAGNOSIS — D225 Melanocytic nevi of trunk: Secondary | ICD-10-CM | POA: Diagnosis not present

## 2021-05-14 DIAGNOSIS — Z1283 Encounter for screening for malignant neoplasm of skin: Secondary | ICD-10-CM | POA: Diagnosis not present

## 2021-05-17 ENCOUNTER — Telehealth: Payer: Self-pay | Admitting: Family Medicine

## 2021-05-17 NOTE — Telephone Encounter (Signed)
LVM for pt to rtn my call to schedule AWV with [facility].  

## 2021-05-27 ENCOUNTER — Other Ambulatory Visit: Payer: Self-pay

## 2021-05-27 ENCOUNTER — Ambulatory Visit (INDEPENDENT_AMBULATORY_CARE_PROVIDER_SITE_OTHER): Payer: Medicare PPO

## 2021-05-27 DIAGNOSIS — Z0000 Encounter for general adult medical examination without abnormal findings: Secondary | ICD-10-CM | POA: Diagnosis not present

## 2021-05-27 NOTE — Progress Notes (Signed)
Subjective:   Elizabeth Duncan is a 70 y.o. female who presents for Medicare Annual (Subsequent) preventive examination.  Review of Systems: N/A      I connected with the patient today by telephone and verified that I am speaking with the correct person using two identifiers. Location patient: home Location nurse: work Persons participating in the telephone visit: patient, nurse.   I discussed the limitations, risks, security and privacy concerns of performing an evaluation and management service by telephone and the availability of in person appointments. I also discussed with the patient that there may be a patient responsible charge related to this service. The patient expressed understanding and verbally consented to this telephonic visit.        Cardiac Risk Factors include: advanced age (>53men, >2 women)     Objective:    Today's Vitals   There is no height or weight on file to calculate BMI.  Advanced Directives 05/27/2021 07/12/2017 03/16/2017  Does Patient Have a Medical Advance Directive? Yes Yes Yes  Type of Paramedic of [place];Living will [place]  Does patient want to make changes to medical advance directive? - No - Patient declined No - Patient declined  Copy of [place] in Chart? No - copy requested No - copy requested No - copy requested    Current Medications (verified) No outpatient encounter medications on file as of 05/27/2021.   No facility-administered encounter medications on file as of 05/27/2021.    Allergies (verified) Amoxicillin   History: Past Medical History:  Diagnosis Date   Congenital vascular malformation    hemangiomas being treated with laser surgery by Azzie Roup (Dr. Kalman Shan)   History of anemia    History of diverticulosis    History of pelvic mass    History of urinary incontinence    Intention tremor    Past Surgical  History:  Procedure Laterality Date   bone spur  2008   Rt 5th toe removed   CARDIOVASCULAR STRESS TEST  2005   normal stress test per pt   COLONOSCOPY  04/17/2009   ESOPHAGOGASTRODUODENOSCOPY     gastritis   EXCISION PARTIAL PHALANX Left 03/16/2017   Procedure: Part excision bone phalanx of left 5th;  Surgeon: Albertine Patricia, DPM;  Location: [facility];  Service: Podiatry;  Laterality: Left;  IVA local   laser treatment of birth mark     @ [facility] (right leg)   Family History  Problem Relation Age of Onset   Alzheimer's disease Mother    Stroke Father        x2   Heart failure Father    Kidney Stones Brother    Diabetes Brother    Bipolar disorder Sister    Colon polyps Sister    Cancer Maternal Grandmother        breast cancer   Breast cancer Maternal Grandmother    Colon cancer Neg Hx    Esophageal cancer Neg Hx    Rectal cancer Neg Hx    Stomach cancer Neg Hx    Social History   Socioeconomic History   Marital status: Married    Spouse name: Not on file   Number of children: Not on file   Years of education: Not on file   Highest education level: Not on file  Occupational History   Not on file  Tobacco Use   Smoking status: Never   Smokeless tobacco: Never  Vaping Use   Vaping Use: Never used  Substance and Sexual Activity   Alcohol use: No    Alcohol/week: 0.0 standard drinks   Drug use: No   Sexual activity: Not on file  Other Topics Concern   Not on file  Social History Narrative   Not on file   Social Determinants of Health   Financial Resource Strain: Low Risk    Difficulty of Paying Living Expenses: Not hard at all  Food Insecurity: No Food Insecurity   Worried About Charity fundraiser in the Last Year: Never true   [place] in the Last Year: Never true  Transportation Needs: No Transportation Needs   Lack of Transportation (Medical): No   Lack of Transportation (Non-Medical): No  Physical Activity: Sufficiently Active   Days  of Exercise per Week: 6 days   Minutes of Exercise per Session: 40 min  Stress: No Stress Concern Present   Feeling of Stress : Not at all  Social Connections: Not on file    Tobacco Counseling Counseling given: Not Answered   Clinical Intake:  Pre-visit preparation completed: Yes  Pain : No/denies pain     Nutritional Risks: None Diabetes: No  How often do you need to have someone help you when you read instructions, pamphlets, or other written materials from your doctor or pharmacy?: 1 - Never  Diabetic: No Nutrition Risk Assessment:  Has the patient had any N/V/D within the last 2 months?  No  Does the patient have any non-healing wounds?  No  Has the patient had any unintentional weight loss or weight gain?  No   Diabetes:  Is the patient diabetic?  No  If diabetic, was a CBG obtained today?   N/A Did the patient bring in their glucometer from home?   N/A How often do you monitor your CBG's? N/A.   Financial Strains and Diabetes Management:  Are you having any financial strains with the device, your supplies or your medication?  N/A .  Does the patient want to be seen by Chronic Care Management for management of their diabetes?   N/A Would the patient like to be referred to a Nutritionist or for Diabetic Management?   N/A    Interpreter Needed?: No  Information entered by :: CJohnson, LPN   Activities of Daily Living In your present state of health, do you have any difficulty performing the following activities: 05/27/2021  Hearing? Y  Comment some hearing issues noted  Vision? N  Difficulty concentrating or making decisions? N  Walking or climbing stairs? N  Dressing or bathing? N  Doing errands, shopping? N  Preparing Food and eating ? N  Using the Toilet? N  In the past six months, have you accidently leaked urine? N  Do you have problems with loss of bowel control? N  Managing your Medications? N  Managing your Finances? N  Housekeeping or  managing your Housekeeping? N  Some recent data might be hidden    Patient Care Team: Tower, Wynelle Fanny, MD as PCP - General (Family Medicine)  Indicate any recent Medical Services you may have received from other than [facility] providers in the past year (date may be approximate).     Assessment:   This is a routine wellness examination for [name].  Hearing/Vision screen Vision Screening - Comments:: Patient gets annual eye exams   Dietary issues and exercise activities discussed: Current Exercise Habits: Home exercise routine;Structured exercise class, Type of exercise: strength  training/weights;walking, Time (Minutes): 35, Frequency (Times/Week): 6, Weekly Exercise (Minutes/Week): 210, Intensity: Moderate, Exercise limited by: None identified   Goals Addressed             This Visit's Progress    Patient Stated       05/27/2021, I will continue aerobics and weight lifting 5-6 days for 35 minutes.         Depression Screen PHQ 2/9 Scores 05/27/2021 11/04/2019 10/30/2018 07/12/2017 01/21/2013  PHQ - 2 Score 0 0 0 0 0  PHQ- 9 Score 0 - - - -    Fall Risk Fall Risk  05/27/2021 11/04/2019 10/30/2018 07/12/2017  Falls in the past year? 0 0 0 No  Number falls in past yr: 0 - 0 -  Injury with Fall? 0 - - -  Risk for fall due to : No Fall Risks - - -  Follow up Falls evaluation completed;Falls prevention discussed Falls evaluation completed - -    FALL RISK PREVENTION PERTAINING TO THE HOME:  Any stairs in or around the home? Yes  If so, are there any without handrails? No  Home free of loose throw rugs in walkways, pet beds, electrical cords, etc? Yes  Adequate lighting in your home to reduce risk of falls? Yes   ASSISTIVE DEVICES UTILIZED TO PREVENT FALLS:  Life alert? No  Use of a cane, walker or w/c? No  Grab bars in the bathroom? No  Shower chair or bench in shower? No  Elevated toilet seat or a handicapped toilet? No   TIMED UP AND GO:  Was the test performed?  N/A  telephone visit  .  Cognitive Function: MMSE - Mini Mental State Exam 05/27/2021  Not completed: Refused       Mini Cog  Mini-Cog screen was not completed. Patient refused. Maximum score is 22. A value of 0 denotes this part of the MMSE was not completed or the patient failed this part of the Mini-Cog screening.  Immunizations Immunization History  Administered Date(s) Administered   Fluad Quad(high Dose 65+) 08/07/2019, 10/08/2020   Influenza Whole 10/12/2001, 09/16/2010   Influenza, High Dose Seasonal PF 08/18/2017   Influenza,inj,Quad PF,6+ Mos 08/26/2014, 08/18/2016, 10/30/2018   Influenza-Unspecified 10/13/2015   PFIZER(Purple Top)SARS-COV-2 Vaccination 01/23/2020, 02/18/2020   PPD Test 02/10/2017   Pneumococcal Conjugate-13 02/10/2017   Pneumococcal Polysaccharide-23 10/30/2018   Td 12/13/1993, 07/17/2007   Tdap 02/10/2017   Zoster, Live 11/13/2015    TDAP status: Up to date  Flu Vaccine status: Up to date  Pneumococcal vaccine status: Up to date  Covid-19 vaccine status: Completed 2 vaccines. Booster discussed.  Qualifies for Shingles Vaccine? Yes   Zostavax completed Yes   Shingrix Completed?: No.    Education has been provided regarding the importance of this vaccine. Patient has been advised to call insurance company to determine out of pocket expense if they have not yet received this vaccine. Advised may also receive vaccine at local pharmacy or Health Dept. Verbalized acceptance and understanding.  Screening Tests Health Maintenance  Topic Date Due   Hepatitis C Screening  Never done   Zoster Vaccines- Shingrix (1 of 2) Never done   COVID-19 Vaccine (3 - Pfizer risk series) 03/17/2020   INFLUENZA VACCINE  07/12/2021   MAMMOGRAM  10/20/2021   COLONOSCOPY (Pts 45-61yrs Insurance coverage will need to be confirmed)  09/17/2026   TETANUS/TDAP  02/11/2027   DEXA SCAN  Completed   PNA vac Low Risk Adult  Completed   HPV VACCINES  Aged Out    Health  Maintenance  Health Maintenance Due  Topic Date Due   Hepatitis C Screening  Never done   Zoster Vaccines- Shingrix (1 of 2) Never done   COVID-19 Vaccine (3 - Pfizer risk series) 03/17/2020    Colorectal cancer screening: Type of screening: Colonoscopy. Completed 09/18/2019. Repeat every 7 years  Mammogram status: Completed 10/20/2020. Repeat every year  Bone Density status: Completed 02/28/2017. Results reflect: Bone density results: NORMAL. Repeat every 2-5 years.  Lung Cancer Screening: (Low Dose CT Chest recommended if Age 58-80 years, 30 pack-year currently smoking OR have quit w/in 15years.) does not qualify.    Additional Screening:  Hepatitis C Screening: does qualify; Completed due  Vision Screening: Recommended annual ophthalmology exams for early detection of glaucoma and other disorders of the eye. Is the patient up to date with their annual eye exam?  Yes  Who is the provider or what is the name of the office in which the patient attends annual eye exams? [place]  If pt is not established with a provider, would they like to be referred to a provider to establish care? No .   Dental Screening: Recommended annual dental exams for proper oral hygiene  Community Resource Referral / Chronic Care Management: CRR required this visit?  No   CCM required this visit?  No      Plan:     I have personally reviewed and noted the following in the patient's chart:   Medical and social history Use of alcohol, tobacco or illicit drugs  Current medications and supplements including opioid prescriptions.  Functional ability and status Nutritional status Physical activity Advanced directives List of other physicians Hospitalizations, surgeries, and ER visits in previous 12 months Vitals Screenings to include cognitive, depression, and falls Referrals and appointments  In addition, I have reviewed and discussed with patient certain preventive protocols, quality  metrics, and best practice recommendations. A written personalized care plan for preventive services as well as general preventive health recommendations were provided to patient.   Due to this being a telephonic visit, the after visit summary with patients personalized plan was offered to patient via office or my-chart. Patient preferred to pick up at office at next visit or via mychart.   Andrez Grime, LPN   1/75/1025

## 2021-05-27 NOTE — Patient Instructions (Signed)
[name] , Thank you for taking time to come for your Medicare Wellness Visit. I appreciate your ongoing commitment to your health goals. Please review the following plan we discussed and let me know if I can assist you in the future.   Screening recommendations/referrals: Colonoscopy: Up to date, completed 09/18/2019, due 09/2026 Mammogram: Up to date, completed 10/20/2020, due 10/2021 Bone Density: Up to date, completed 02/28/2017, due 2-5 years  Recommended yearly ophthalmology/optometry visit for glaucoma screening and checkup Recommended yearly dental visit for hygiene and checkup  Vaccinations: Influenza vaccine: Up to date, completed 10/08/2020, due 07/2021 Pneumococcal vaccine: Completed series Tdap vaccine: Up to date, completed 02/10/2017, due 02/2027 Shingles vaccine: due, check with your insurance regarding coverage if interested    Covid-19:Completed 2 vaccines. Booster discussed.  Advanced directives: Please bring a copy of your POA (Power of Attorney) and/or Living Will to your next appointment.   Conditions/risks identified: none  Next appointment: Follow up in one year for your annual wellness visit    Preventive Care 65 Years and Older, Female Preventive care refers to lifestyle choices and visits with your health care provider that can promote health and wellness. What does preventive care include? A yearly physical exam. This is also called an annual well check. Dental exams once or twice a year. Routine eye exams. Ask your health care provider how often you should have your eyes checked. Personal lifestyle choices, including: Daily care of your teeth and gums. Regular physical activity. Eating a healthy diet. Avoiding tobacco and drug use. Limiting alcohol use. Practicing safe sex. Taking low-dose aspirin every day. Taking vitamin and mineral supplements as recommended by your health care provider. What happens during an annual well check? The services and  screenings done by your health care provider during your annual well check will depend on your age, overall health, lifestyle risk factors, and family history of disease. Counseling  Your health care provider may ask you questions about your: Alcohol use. Tobacco use. Drug use. Emotional well-being. Home and relationship well-being. Sexual activity. Eating habits. History of falls. Memory and ability to understand (cognition). Work and work Statistician. Reproductive health. Screening  You may have the following tests or measurements: Height, weight, and BMI. Blood pressure. Lipid and cholesterol levels. These may be checked every 5 years, or more frequently if you are over 56 years old. Skin check. Lung cancer screening. You may have this screening every year starting at age 47 if you have a 30-pack-year history of smoking and currently smoke or have quit within the past 15 years. Fecal occult blood test (FOBT) of the stool. You may have this test every year starting at age 62. Flexible sigmoidoscopy or colonoscopy. You may have a sigmoidoscopy every 5 years or a colonoscopy every 10 years starting at age 7. Hepatitis C blood test. Hepatitis B blood test. Sexually transmitted disease (STD) testing. Diabetes screening. This is done by checking your blood sugar (glucose) after you have not eaten for a while (fasting). You may have this done every 1-3 years. Bone density scan. This is done to screen for osteoporosis. You may have this done starting at age 4. Mammogram. This may be done every 1-2 years. Talk to your health care provider about how often you should have regular mammograms. Talk with your health care provider about your test results, treatment options, and if necessary, the need for more tests. Vaccines  Your health care provider may recommend certain vaccines, such as: Influenza vaccine. This is recommended  every year. Tetanus, diphtheria, and acellular pertussis (Tdap,  Td) vaccine. You may need a Td booster every 10 years. Zoster vaccine. You may need this after age 57. Pneumococcal 13-valent conjugate (PCV13) vaccine. One dose is recommended after age 81. Pneumococcal polysaccharide (PPSV23) vaccine. One dose is recommended after age 82. Talk to your health care provider about which screenings and vaccines you need and how often you need them. This information is not intended to replace advice given to you by your health care provider. Make sure you discuss any questions you have with your health care provider. Document Released: 12/25/2015 Document Revised: 08/17/2016 Document Reviewed: 09/29/2015 Elsevier Interactive Patient Education  2017 [place] Prevention in the Home Falls can cause injuries. They can happen to people of all ages. There are many things you can do to make your home safe and to help prevent falls. What can I do on the outside of my home? Regularly fix the edges of walkways and driveways and fix any cracks. Remove anything that might make you trip as you walk through a door, such as a raised step or threshold. Trim any bushes or trees on the path to your home. Use bright outdoor lighting. Clear any walking paths of anything that might make someone trip, such as rocks or tools. Regularly check to see if handrails are loose or broken. Make sure that both sides of any steps have handrails. Any raised decks and porches should have guardrails on the edges. Have any leaves, snow, or ice cleared regularly. Use sand or salt on walking paths during winter. Clean up any spills in your garage right away. This includes oil or grease spills. What can I do in the bathroom? Use night lights. Install grab bars by the toilet and in the tub and shower. Do not use towel bars as grab bars. Use non-skid mats or decals in the tub or shower. If you need to sit down in the shower, use a plastic, non-slip stool. Keep the floor dry. Clean up any  water that spills on the floor as soon as it happens. Remove soap buildup in the tub or shower regularly. Attach bath mats securely with double-sided non-slip rug tape. Do not have throw rugs and other things on the floor that can make you trip. What can I do in the bedroom? Use night lights. Make sure that you have a light by your bed that is easy to reach. Do not use any sheets or blankets that are too big for your bed. They should not hang down onto the floor. Have a firm chair that has side arms. You can use this for support while you get dressed. Do not have throw rugs and other things on the floor that can make you trip. What can I do in the kitchen? Clean up any spills right away. Avoid walking on wet floors. Keep items that you use a lot in easy-to-reach places. If you need to reach something above you, use a strong step stool that has a grab bar. Keep electrical cords out of the way. Do not use floor polish or wax that makes floors slippery. If you must use wax, use non-skid floor wax. Do not have throw rugs and other things on the floor that can make you trip. What can I do with my stairs? Do not leave any items on the stairs. Make sure that there are handrails on both sides of the stairs and use them. Fix handrails that are broken  or loose. Make sure that handrails are as long as the stairways. Check any carpeting to make sure that it is firmly attached to the stairs. Fix any carpet that is loose or worn. Avoid having throw rugs at the top or bottom of the stairs. If you do have throw rugs, attach them to the floor with carpet tape. Make sure that you have a light switch at the top of the stairs and the bottom of the stairs. If you do not have them, ask someone to add them for you. What else can I do to help prevent falls? Wear shoes that: Do not have high heels. Have rubber bottoms. Are comfortable and fit you well. Are closed at the toe. Do not wear sandals. If you use a  stepladder: Make sure that it is fully opened. Do not climb a closed stepladder. Make sure that both sides of the stepladder are locked into place. Ask someone to hold it for you, if possible. Clearly mark and make sure that you can see: Any grab bars or handrails. First and last steps. Where the edge of each step is. Use tools that help you move around (mobility aids) if they are needed. These include: Canes. Walkers. Scooters. Crutches. Turn on the lights when you go into a dark area. Replace any light bulbs as soon as they burn out. Set up your furniture so you have a clear path. Avoid moving your furniture around. If any of your floors are uneven, fix them. If there are any pets around you, be aware of where they are. Review your medicines with your doctor. Some medicines can make you feel dizzy. This can increase your chance of falling. Ask your doctor what other things that you can do to help prevent falls. This information is not intended to replace advice given to you by your health care provider. Make sure you discuss any questions you have with your health care provider. Document Released: 09/24/2009 Document Revised: 05/05/2016 Document Reviewed: 01/02/2015 Elsevier Interactive Patient Education  2017 [employer].

## 2021-05-27 NOTE — Progress Notes (Signed)
PCP notes:  Health Maintenance: Shingrix- due Covid booster- due    Abnormal Screenings: none   Patient concerns: none   Nurse concerns: none   Next PCP appt.: 06/03/2021 @ 11 am

## 2021-05-28 ENCOUNTER — Other Ambulatory Visit (INDEPENDENT_AMBULATORY_CARE_PROVIDER_SITE_OTHER): Payer: Medicare PPO

## 2021-05-28 ENCOUNTER — Other Ambulatory Visit: Payer: Self-pay

## 2021-05-28 DIAGNOSIS — Z0000 Encounter for general adult medical examination without abnormal findings: Secondary | ICD-10-CM

## 2021-05-28 LAB — CBC WITH DIFFERENTIAL/PLATELET
Basophils Absolute: 0 10*3/uL (ref 0.0–0.1)
Basophils Relative: 0.8 % (ref 0.0–3.0)
Eosinophils Absolute: 0.2 10*3/uL (ref 0.0–0.7)
Eosinophils Relative: 4.2 % (ref 0.0–5.0)
HCT: 38.3 % (ref 36.0–46.0)
Hemoglobin: 12.8 g/dL (ref 12.0–15.0)
Lymphocytes Relative: 28.5 % (ref 12.0–46.0)
Lymphs Abs: 1.2 10*3/uL (ref 0.7–4.0)
MCHC: 33.4 g/dL (ref 30.0–36.0)
MCV: 93.5 fl (ref 78.0–100.0)
Monocytes Absolute: 0.4 10*3/uL (ref 0.1–1.0)
Monocytes Relative: 8.5 % (ref 3.0–12.0)
Neutro Abs: 2.5 10*3/uL (ref 1.4–7.7)
Neutrophils Relative %: 58 % (ref 43.0–77.0)
Platelets: 221 10*3/uL (ref 150.0–400.0)
RBC: 4.1 Mil/uL (ref 3.87–5.11)
RDW: 13.6 % (ref 11.5–15.5)
WBC: 4.3 10*3/uL (ref 4.0–10.5)

## 2021-05-28 LAB — COMPREHENSIVE METABOLIC PANEL
ALT: 16 U/L (ref 0–35)
AST: 21 U/L (ref 0–37)
Albumin: 3.9 g/dL (ref 3.5–5.2)
Alkaline Phosphatase: 52 U/L (ref 39–117)
BUN: 17 mg/dL (ref 6–23)
CO2: 29 mEq/L (ref 19–32)
Calcium: 8.7 mg/dL (ref 8.4–10.5)
Chloride: 106 mEq/L (ref 96–112)
Creatinine, Ser: 0.73 mg/dL (ref 0.40–1.20)
GFR: 83.9 mL/min (ref >60.00)
Glucose, Bld: 84 mg/dL (ref 70–99)
Potassium: 4.4 mEq/L (ref 3.5–5.1)
Sodium: 141 mEq/L (ref 135–145)
Total Bilirubin: 0.7 mg/dL (ref 0.2–1.2)
Total Protein: 6 g/dL (ref 6.0–8.3)

## 2021-05-28 LAB — LIPID PANEL
Cholesterol: 199 mg/dL (ref 0–200)
HDL: 67.3 mg/dL (ref >39.00)
LDL Cholesterol: 119 mg/dL — ABNORMAL HIGH (ref 0–99)
NonHDL: 132.12
Total CHOL/HDL Ratio: 3
Triglycerides: 65 mg/dL (ref 0.0–149.0)
VLDL: 13 mg/dL (ref 0.0–40.0)

## 2021-05-28 LAB — TSH: TSH: 2.36 u[IU]/mL (ref 0.35–4.50)

## 2021-06-03 ENCOUNTER — Other Ambulatory Visit: Payer: Self-pay

## 2021-06-03 ENCOUNTER — Ambulatory Visit (INDEPENDENT_AMBULATORY_CARE_PROVIDER_SITE_OTHER): Payer: Medicare PPO | Admitting: Family Medicine

## 2021-06-03 ENCOUNTER — Encounter: Payer: Self-pay | Admitting: Family Medicine

## 2021-06-03 VITALS — BP 124/82 | HR 69 | Temp 97.3°F | Ht 65.5 in | Wt 148.5 lb

## 2021-06-03 DIAGNOSIS — Z0000 Encounter for general adult medical examination without abnormal findings: Secondary | ICD-10-CM | POA: Diagnosis not present

## 2021-06-03 MED ORDER — ZOSTER VAC RECOMB ADJUVANTED 50 MCG/0.5ML IM SUSR: 0.5000 mL | Freq: Once | INTRAMUSCULAR | 1 refills | Status: AC

## 2021-06-03 NOTE — Progress Notes (Signed)
Subjective:    Patient ID: Elizabeth Duncan, female    DOB: Mar 04, 1951, 70 y.o.   MRN: [id number]  This visit occurred during the SARS-CoV-2 public health emergency.  Safety protocols were in place, including screening questions prior to the visit, additional usage of staff PPE, and extensive cleaning of exam room while observing appropriate contact time as indicated for disinfecting solutions.   HPI Here for health maintenance exam and to review chronic medical problems    Wt Readings from Last 3 Encounters:  06/03/21 148 lb 8 oz (67.4 kg)  11/04/19 149 lb (67.6 kg)  09/18/19 151 lb (68.5 kg)   24.34 kg/m  Exercises 5 days per week  HIIT and some resistance   Diet is fair -too much sugar Has cut way back on soda  Drinks lots of water   Has been gardening   Had a bout with bronchitis /chest cold  Coughed really hard  Took abx and mucinex   Had amw on 05/27/21  Zoster status -interested in shingrix vaccine  Covid immunized and declines booster  Had covid in 8/21  Mammogram 11/21 Self breast exam -no lumps   Colonoscopy 10/20 with 7 y recall   Dexa 3/18 - bmd in nl range Falls -none Fractures -none  Supplements -none  Exercise - very good   BP Readings from Last 3 Encounters:  06/03/21 124/82  11/04/19 122/76  09/18/19 129/60   Pulse Readings from Last 3 Encounters:  06/03/21 69  11/04/19 61  09/18/19 60     Cholesterol  Lab Results  Component Value Date   CHOL 199 05/28/2021   CHOL 219 (H) 11/04/2019   CHOL 190 10/30/2018   Lab Results  Component Value Date   HDL 67.30 05/28/2021   HDL 67.80 11/04/2019   HDL 60.20 10/30/2018   Lab Results  Component Value Date   LDLCALC 119 (H) 05/28/2021   LDLCALC 136 (H) 11/04/2019   LDLCALC 118 (H) 10/30/2018   Lab Results  Component Value Date   TRIG 65.0 05/28/2021   TRIG 77.0 11/04/2019   TRIG 58.0 10/30/2018   Lab Results  Component Value Date   CHOLHDL 3 05/28/2021   CHOLHDL 3  11/04/2019   CHOLHDL 3 10/30/2018   No results found for: LDLDIRECT  Eats eggs regularly (has chicken)  Avoids bacon/sausage   Lab Results  Component Value Date   WBC 4.3 05/28/2021   HGB 12.8 05/28/2021   HCT 38.3 05/28/2021   MCV 93.5 05/28/2021   PLT 221.0 05/28/2021   Lab Results  Component Value Date   CREATININE 0.73 05/28/2021   BUN 17 05/28/2021   NA 141 05/28/2021   K 4.4 05/28/2021   CL 106 05/28/2021   CO2 29 05/28/2021   Lab Results  Component Value Date   ALT 16 05/28/2021   AST 21 05/28/2021   ALKPHOS 52 05/28/2021   BILITOT 0.7 05/28/2021   Lab Results  Component Value Date   TSH 2.36 05/28/2021    Patient Active Problem List   Diagnosis Date Noted   COVID-19 07/20/2020   Medicare annual wellness visit, subsequent 11/04/2019   Lump 05/16/2019   Estrogen deficiency 02/10/2017   Routine general medical examination at a health care facility 01/21/2013   Stress incontinence in female 01/21/2013   INTENTION TREMOR 07/17/2007   Past Medical History:  Diagnosis Date   Congenital vascular malformation    hemangiomas being treated with laser surgery by Azzie Roup (Dr. Kalman Shan)   History  of anemia    History of diverticulosis    History of pelvic mass    History of urinary incontinence    Intention tremor    Past Surgical History:  Procedure Laterality Date   bone spur  2008   Rt 5th toe removed   CARDIOVASCULAR STRESS TEST  2005   normal stress test per pt   COLONOSCOPY  04/17/2009   ESOPHAGOGASTRODUODENOSCOPY     gastritis   EXCISION PARTIAL PHALANX Left 03/16/2017   Procedure: Part excision bone phalanx of left 5th;  Surgeon: Albertine Patricia, DPM;  Location: [place];  Service: Podiatry;  Laterality: Left;  IVA local   laser treatment of birth mark     @ [facility] (right leg)   Social History   Tobacco Use   Smoking status: Never   Smokeless tobacco: Never  Vaping Use   Vaping Use: Never used  Substance Use Topics   Alcohol use:  No    Alcohol/week: 0.0 standard drinks   Drug use: No   Family History  Problem Relation Age of Onset   Alzheimer's disease Mother    Stroke Father        x2   Heart failure Father    Kidney Stones Brother    Diabetes Brother    Bipolar disorder Sister    Colon polyps Sister    Cancer Maternal Grandmother        breast cancer   Breast cancer Maternal Grandmother    Colon cancer Neg Hx    Esophageal cancer Neg Hx    Rectal cancer Neg Hx    Stomach cancer Neg Hx    Allergies  Allergen Reactions   Amoxicillin Rash        No current outpatient medications on file prior to visit.   No current facility-administered medications on file prior to visit.     Review of Systems  Constitutional:  Negative for activity change, appetite change, fatigue, fever and unexpected weight change.  HENT:  Negative for congestion, ear pain, rhinorrhea, sinus pressure and sore throat.   Eyes:  Negative for pain, redness and visual disturbance.  Respiratory:  Negative for cough, shortness of breath and wheezing.   Cardiovascular:  Negative for chest pain and palpitations.  Gastrointestinal:  Negative for abdominal pain, blood in stool, constipation and diarrhea.  Endocrine: Negative for polydipsia and polyuria.  Genitourinary:  Negative for dysuria, frequency and urgency.  Musculoskeletal:  Negative for arthralgias, back pain and myalgias.  Skin:  Negative for pallor and rash.  Allergic/Immunologic: Negative for environmental allergies.  Neurological:  Negative for dizziness, syncope and headaches.  Hematological:  Negative for adenopathy. Does not bruise/bleed easily.  Psychiatric/Behavioral:  Negative for decreased concentration and dysphoric mood. The patient is not nervous/anxious.       Objective:   Physical Exam Constitutional:      General: She is not in acute distress.    Appearance: Normal appearance. She is well-developed and normal weight. She is not ill-appearing or  diaphoretic.  HENT:     Head: Normocephalic and atraumatic.     Right Ear: Tympanic membrane, ear canal and external ear normal.     Left Ear: Tympanic membrane, ear canal and external ear normal.     Nose: Nose normal. No congestion.     Mouth/Throat:     Mouth: Mucous membranes are moist.     Pharynx: Oropharynx is clear. No posterior oropharyngeal erythema.  Eyes:     General: No scleral icterus.  Extraocular Movements: Extraocular movements intact.     Conjunctiva/sclera: Conjunctivae normal.     Pupils: Pupils are equal, round, and reactive to light.  Neck:     Thyroid: No thyromegaly.     Vascular: No carotid bruit or JVD.  Cardiovascular:     Rate and Rhythm: Normal rate and regular rhythm.     Pulses: Normal pulses.     Heart sounds: Normal heart sounds.    No gallop.  Pulmonary:     Effort: Pulmonary effort is normal. No respiratory distress.     Breath sounds: Normal breath sounds. No wheezing.     Comments: Good air exch Chest:     Chest wall: No tenderness.  Abdominal:     General: Bowel sounds are normal. There is no distension or abdominal bruit.     Palpations: Abdomen is soft. There is no mass.     Tenderness: There is no abdominal tenderness.     Hernia: No hernia is present.  Genitourinary:    Comments: Breast exam: No mass, nodules, thickening, tenderness, bulging, retraction, inflamation, nipple discharge or skin changes noted.  No axillary or clavicular LA.     Musculoskeletal:        General: No tenderness. Normal range of motion.     Cervical back: Normal range of motion and neck supple. No rigidity. No muscular tenderness.     Right lower leg: No edema.     Left lower leg: No edema.     Comments: No kyphosis   Lymphadenopathy:     Cervical: No cervical adenopathy.  Skin:    General: Skin is warm and dry.     Coloration: Skin is not pale.     Findings: No erythema or rash.     Comments: Solar lentigines diffusely   Neurological:     Mental  Status: She is alert. Mental status is at baseline.     Cranial Nerves: No cranial nerve deficit.     Motor: No abnormal muscle tone.     Coordination: Coordination normal.     Gait: Gait normal.     Deep Tendon Reflexes: Reflexes are normal and symmetric. Reflexes normal.  Psychiatric:        Mood and Affect: Mood normal.        Cognition and Memory: Cognition and memory normal.          Assessment & Plan:   Problem List Items Addressed This Visit       Other   Routine general medical examination at a health care facility - Primary    Reviewed health habits including diet and exercise and skin cancer prevention Reviewed appropriate screening tests for age  Also reviewed health mt list, fam hx and immunization status , as well as social and family history   See HPI Labs reviewed  Good health habits Enc to add ca and D for bone health  dexa nl in 2018 Mammogram and colonoscopy are utd  Disc low sat fat diet for cholesterol

## 2021-06-03 NOTE — Assessment & Plan Note (Signed)
Reviewed health habits including diet and exercise and skin cancer prevention Reviewed appropriate screening tests for age  Also reviewed health mt list, fam hx and immunization status , as well as social and family history   See HPI Labs reviewed  Good health habits Enc to add ca and D for bone health  dexa nl in 2018 Mammogram and colonoscopy are utd  Disc low sat fat diet for cholesterol

## 2021-06-03 NOTE — Patient Instructions (Addendum)
Get your shingrix vaccine when you can  Take the px to your pharmacy  Continue sunscreen use  Continue good exercise   For cholesterol Avoid red meat/ fried foods/ egg yolks/ fatty breakfast meats/ butter, cheese and high fat dairy/ and shellfish    Try to get 1200-1500 mg of calcium per day with at least 1000 iu of vitamin D - for bone health

## 2021-06-25 DIAGNOSIS — L309 Dermatitis, unspecified: Secondary | ICD-10-CM | POA: Diagnosis not present

## 2021-06-25 DIAGNOSIS — L816 Other disorders of diminished melanin formation: Secondary | ICD-10-CM | POA: Diagnosis not present

## 2021-08-06 ENCOUNTER — Ambulatory Visit: Payer: Self-pay | Admitting: Internal Medicine

## 2021-08-12 ENCOUNTER — Other Ambulatory Visit: Payer: Self-pay

## 2021-08-12 ENCOUNTER — Ambulatory Visit
Admission: RE | Admit: 2021-08-12 | Discharge: 2021-08-12 | Disposition: A | Discharge status: Home / Self Care | Payer: Medicare PPO | Source: Ambulatory Visit | Attending: Family Medicine | Admitting: Family Medicine

## 2021-08-12 ENCOUNTER — Ambulatory Visit (INDEPENDENT_AMBULATORY_CARE_PROVIDER_SITE_OTHER): Payer: Medicare PPO | Admitting: Family Medicine

## 2021-08-12 ENCOUNTER — Encounter: Payer: Self-pay | Admitting: Family Medicine

## 2021-08-12 VITALS — BP 136/72 | HR 80 | Temp 98.3°F | Ht 65.5 in | Wt 142.5 lb

## 2021-08-12 DIAGNOSIS — R110 Nausea: Secondary | ICD-10-CM | POA: Insufficient documentation

## 2021-08-12 DIAGNOSIS — R8290 Unspecified abnormal findings in urine: Secondary | ICD-10-CM

## 2021-08-12 DIAGNOSIS — R1011 Right upper quadrant pain: Secondary | ICD-10-CM | POA: Insufficient documentation

## 2021-08-12 DIAGNOSIS — N281 Cyst of kidney, acquired: Secondary | ICD-10-CM | POA: Diagnosis not present

## 2021-08-12 LAB — HEPATIC FUNCTION PANEL
ALT: 15 U/L (ref 0–35)
AST: 19 U/L (ref 0–37)
Albumin: 4.1 g/dL (ref 3.5–5.2)
Alkaline Phosphatase: 55 U/L (ref 39–117)
Bilirubin, Direct: 0.2 mg/dL (ref 0.0–0.3)
Total Bilirubin: 1.3 mg/dL — ABNORMAL HIGH (ref 0.2–1.2)
Total Protein: 7 g/dL (ref 6.0–8.3)

## 2021-08-12 LAB — CBC WITH DIFFERENTIAL/PLATELET
Basophils Absolute: 0 10*3/uL (ref 0.0–0.1)
Basophils Relative: 0.5 % (ref 0.0–3.0)
Eosinophils Absolute: 0 10*3/uL (ref 0.0–0.7)
Eosinophils Relative: 0.5 % (ref 0.0–5.0)
HCT: 42.6 % (ref 36.0–46.0)
Hemoglobin: 14.1 g/dL (ref 12.0–15.0)
Lymphocytes Relative: 15.3 % (ref 12.0–46.0)
Lymphs Abs: 1.2 10*3/uL (ref 0.7–4.0)
MCHC: 33.1 g/dL (ref 30.0–36.0)
MCV: 93.8 fl (ref 78.0–100.0)
Monocytes Absolute: 0.9 10*3/uL (ref 0.1–1.0)
Monocytes Relative: 11.1 % (ref 3.0–12.0)
Neutro Abs: 5.8 10*3/uL (ref 1.4–7.7)
Neutrophils Relative %: 72.6 % (ref 43.0–77.0)
Platelets: 212 10*3/uL (ref 150.0–400.0)
RBC: 4.54 Mil/uL (ref 3.87–5.11)
RDW: 14 % (ref 11.5–15.5)
WBC: 7.9 10*3/uL (ref 4.0–10.5)

## 2021-08-12 LAB — BASIC METABOLIC PANEL
BUN: 15 mg/dL (ref 6–23)
CO2: 28 mEq/L (ref 19–32)
Calcium: 9.1 mg/dL (ref 8.4–10.5)
Chloride: 103 mEq/L (ref 96–112)
Creatinine, Ser: 0.75 mg/dL (ref 0.40–1.20)
GFR: 81.11 mL/min (ref >60.00)
Glucose, Bld: 89 mg/dL (ref 70–99)
Potassium: 4.3 mEq/L (ref 3.5–5.1)
Sodium: 137 mEq/L (ref 135–145)

## 2021-08-12 LAB — POC URINALSYSI DIPSTICK (AUTOMATED)
Bilirubin, UA: 1
Blood, UA: 50
Glucose, UA: NEGATIVE
Ketones, UA: 5
Leukocytes, UA: NEGATIVE
Nitrite, UA: NEGATIVE
Protein, UA: NEGATIVE
Spec Grav, UA: >=1.03 — AB (ref 1.010–1.025)
Urobilinogen, UA: 0.2 E.U./dL
pH, UA: 5.5 (ref 5.0–8.0)

## 2021-08-12 LAB — LIPASE: Lipase: 40 U/L (ref 11.0–59.0)

## 2021-08-12 NOTE — Patient Instructions (Signed)
I want to get stat labs (liver/kidney/blood count/pancreas enzyme)  Also abdominal ultrasound   Stay off ibuprofen Eat bland -no fat if possible Stay hydrated   If symptoms become severe alert [place] and go to the ER

## 2021-08-12 NOTE — Progress Notes (Signed)
Subjective:    Patient ID: Elizabeth Duncan, female    DOB: 03/01/1951, 70 y.o.   MRN: [id number]  This visit occurred during the SARS-CoV-2 public health emergency.  Safety protocols were in place, including screening questions prior to the visit, additional usage of staff PPE, and extensive cleaning of exam room while observing appropriate contact time as indicated for disinfecting solutions.   HPI Pt presents to discuss GI symptoms   Wt Readings from Last 3 Encounters:  08/12/21 142 lb 8 oz (64.6 kg)  06/03/21 148 lb 8 oz (67.4 kg)  11/04/19 149 lb (67.6 kg)   23.35 kg/m  Tuesday -bloating and loose stool  By pm- RUQ pain that rad to the back  Sharp severe pain -did not sleep  Nausea ,no vomiting  Same yesterday  Yesterday - a little less pain  Low grade temp one day  Hurts to take deep breath   Eats fried food infrequently Had fries on Monday  Spaghetti Tuesday   Has been taking advil for arthritis -stopped it  No burning  No heartburn No black stool or blood in stool   Some better today  Dry toast this am   No h/o renal stone  No urinary symptoms or blood    Results for orders placed or performed in visit on 08/12/21  POCT Urinalysis Dipstick (Automated)  Result Value Ref Range   Color, UA Dark Yellow    Clarity, UA Clear    Glucose, UA Negative Negative   Bilirubin, UA 1 mg/dL    Ketones, UA 5 mg/dL    Spec Grav, UA >=1.030 (A) 1.010 - 1.025   Blood, UA 50 Ery/uL    pH, UA 5.5 5.0 - 8.0   Protein, UA Negative Negative   Urobilinogen, UA 0.2 0.2 or 1.0 E.U./dL   Nitrite, UA Negative    Leukocytes, UA Negative Negative   Urine culture pending   Patient Active Problem List   Diagnosis Date Noted   Abdominal pain, RUQ 08/12/2021   Nausea 08/12/2021   Medicare annual wellness visit, subsequent 11/04/2019   Estrogen deficiency 02/10/2017   Routine general medical examination at a health care facility 01/21/2013   Stress incontinence in female  01/21/2013   INTENTION TREMOR 07/17/2007   Past Medical History:  Diagnosis Date   Congenital vascular malformation    hemangiomas being treated with laser surgery by Azzie Roup (Dr. Kalman Shan)   History of anemia    History of diverticulosis    History of pelvic mass    History of urinary incontinence    Intention tremor    Past Surgical History:  Procedure Laterality Date   bone spur  2008   Rt 5th toe removed   CARDIOVASCULAR STRESS TEST  2005   normal stress test per pt   COLONOSCOPY  04/17/2009   ESOPHAGOGASTRODUODENOSCOPY     gastritis   EXCISION PARTIAL PHALANX Left 03/16/2017   Procedure: Part excision bone phalanx of left 5th;  Surgeon: Albertine Patricia, DPM;  Location: [facility];  Service: Podiatry;  Laterality: Left;  IVA local   laser treatment of birth mark     @ [facility] (right leg)   Social History   Tobacco Use   Smoking status: Never   Smokeless tobacco: Never  Vaping Use   Vaping Use: Never used  Substance Use Topics   Alcohol use: No    Alcohol/week: 0.0 standard drinks   Drug use: No   Family History  Problem Relation  Age of Onset   Alzheimer's disease Mother    Stroke Father        x2   Heart failure Father    Kidney Stones Brother    Diabetes Brother    Bipolar disorder Sister    Colon polyps Sister    Cancer Maternal Grandmother        breast cancer   Breast cancer Maternal Grandmother    Colon cancer Neg Hx    Esophageal cancer Neg Hx    Rectal cancer Neg Hx    Stomach cancer Neg Hx    Allergies  Allergen Reactions   Amoxicillin Rash        No current outpatient medications on file prior to visit.   No current facility-administered medications on file prior to visit.     Review of Systems  Constitutional:  Negative for activity change, appetite change, fatigue, fever and unexpected weight change.  HENT:  Negative for congestion, ear pain, rhinorrhea, sinus pressure and sore throat.   Eyes:  Negative for pain, redness and  visual disturbance.  Respiratory:  Negative for cough, shortness of breath and wheezing.   Cardiovascular:  Negative for chest pain and palpitations.  Gastrointestinal:  Positive for abdominal pain and nausea. Negative for abdominal distention, anal bleeding, blood in stool, constipation, diarrhea, rectal pain and vomiting.  Endocrine: Negative for polydipsia and polyuria.  Genitourinary:  Negative for difficulty urinating, dysuria, frequency, hematuria, pelvic pain and urgency.  Musculoskeletal:  Negative for arthralgias, back pain and myalgias.  Skin:  Negative for pallor and rash.  Allergic/Immunologic: Negative for environmental allergies.  Neurological:  Negative for dizziness, syncope and headaches.  Hematological:  Negative for adenopathy. Does not bruise/bleed easily.  Psychiatric/Behavioral:  Negative for decreased concentration and dysphoric mood. The patient is not nervous/anxious.       Objective:   Physical Exam Constitutional:      General: She is not in acute distress.    Appearance: She is well-developed and normal weight. She is not ill-appearing or diaphoretic.  HENT:     Head: Normocephalic and atraumatic.  Eyes:     General: No scleral icterus.    Conjunctiva/sclera: Conjunctivae normal.     Pupils: Pupils are equal, round, and reactive to light.     Comments: No icterus   Cardiovascular:     Rate and Rhythm: Normal rate and regular rhythm.     Heart sounds: Normal heart sounds.  Pulmonary:     Effort: Pulmonary effort is normal. No respiratory distress.     Breath sounds: Normal breath sounds. No wheezing or rales.  Abdominal:     General: Abdomen is flat. Bowel sounds are normal. There is no distension.     Palpations: Abdomen is soft. There is no hepatomegaly, splenomegaly or mass.     Tenderness: There is abdominal tenderness in the right upper quadrant. There is no right CVA tenderness, left CVA tenderness, guarding or rebound. Positive signs include  Murphy's sign. Negative signs include McBurney's sign.     Hernia: No hernia is present.  Musculoskeletal:     Cervical back: Normal range of motion and neck supple.  Lymphadenopathy:     Cervical: No cervical adenopathy.  Skin:    General: Skin is warm and dry.     Coloration: Skin is not jaundiced or pale.     Findings: No erythema.  Neurological:     Mental Status: She is alert.  Psychiatric:        Mood and  Affect: Mood normal.          Assessment & Plan:   Problem List Items Addressed This Visit       Other   Abdominal pain, RUQ - Primary    RUQ abd pain radiating to back and R shoulder  Sharp and severe/waxes and wanes  Low grade temp ? One day and loose stool  Holding her advil  Eating bland (did eat fries and pasta sauce before it started)  No urinary symptoms (some blood on urine dip also concentrated with bilirubin) Disc differential incl gallbladder /gastritis and less likely renal stone  Viral gastroenteritis also in the differential  Stat labs today and [place] abd sched for 3:30  Pending results inst to go to ER if symptoms become severe  Avoid fatty foods  Drink water        Relevant Orders   CBC with Differential/Platelet   Basic metabolic panel   Hepatic function panel   Lipase   US Abdomen Complete   POCT Urinalysis Dipstick (Automated) (Completed)   Urine Culture   Nausea    With RUQ pain  See A/P  Lab/us pending      Relevant Orders   US Abdomen Complete   Urine Culture   Other Visit Diagnoses     Abnormal urinalysis       Relevant Orders   Urine Culture

## 2021-08-12 NOTE — Assessment & Plan Note (Signed)
RUQ abd pain radiating to back and R shoulder  Sharp and severe/waxes and wanes  Low grade temp ? One day and loose stool  Holding her advil  Eating bland (did eat fries and pasta sauce before it started)  No urinary symptoms (some blood on urine dip also concentrated with bilirubin) Disc differential incl gallbladder /gastritis and less likely renal stone  Viral gastroenteritis also in the differential  Stat labs today and [place] abd sched for 3:30  Pending results inst to go to ER if symptoms become severe  Avoid fatty foods  Drink water

## 2021-08-12 NOTE — Assessment & Plan Note (Signed)
With RUQ pain  See A/P  Lab/us pending

## 2021-08-13 ENCOUNTER — Ambulatory Visit
Admission: RE | Admit: 2021-08-13 | Discharge: 2021-08-13 | Disposition: A | Discharge status: Home / Self Care | Payer: Medicare PPO | Source: Ambulatory Visit | Attending: Family Medicine | Admitting: Family Medicine

## 2021-08-13 ENCOUNTER — Telehealth: Payer: Self-pay | Admitting: Family Medicine

## 2021-08-13 DIAGNOSIS — K5730 Diverticulosis of large intestine without perforation or abscess without bleeding: Secondary | ICD-10-CM | POA: Diagnosis not present

## 2021-08-13 DIAGNOSIS — N281 Cyst of kidney, acquired: Secondary | ICD-10-CM | POA: Diagnosis not present

## 2021-08-13 DIAGNOSIS — R1011 Right upper quadrant pain: Secondary | ICD-10-CM | POA: Insufficient documentation

## 2021-08-13 DIAGNOSIS — R3129 Other microscopic hematuria: Secondary | ICD-10-CM

## 2021-08-13 DIAGNOSIS — I878 Other specified disorders of veins: Secondary | ICD-10-CM | POA: Diagnosis not present

## 2021-08-13 DIAGNOSIS — R109 Unspecified abdominal pain: Secondary | ICD-10-CM | POA: Insufficient documentation

## 2021-08-13 DIAGNOSIS — M160 Bilateral primary osteoarthritis of hip: Secondary | ICD-10-CM | POA: Diagnosis not present

## 2021-08-13 LAB — URINE CULTURE
MICRO NUMBER:: 12321910
SPECIMEN QUALITY:: ADEQUATE

## 2021-08-13 NOTE — Telephone Encounter (Signed)
Spoke to pt and she has since called Elizabeth Duncan back and she is in the process of scheduling her CT  FYI to PCP

## 2021-08-13 NOTE — Telephone Encounter (Addendum)
CT approved by insurance and called patient to schedule, LM to return call  Need to make sure that she can go today before scheduling

## 2021-08-13 NOTE — Telephone Encounter (Signed)
Spoke to pt by phone  Discussed flank and RUQ pain and [place] results   Would like to get CT to r/o renal stone for this pain (did have hematuria) Also need to further image cyst on L kidney and further imaging recommended (opt for CT with and without) Labs in epic, reassuring   Ordered CT stat   Will cc [facility] and [facility]   Please check in and see how she is feeling this am

## 2021-08-19 NOTE — Telephone Encounter (Signed)
Mrs rotolo called in returning [facility] phone call

## 2021-09-01 NOTE — Telephone Encounter (Signed)
Left VM requesting pt to call the office back 

## 2021-09-14 ENCOUNTER — Telehealth: Payer: Self-pay | Admitting: Family Medicine

## 2021-09-14 DIAGNOSIS — N281 Cyst of kidney, acquired: Secondary | ICD-10-CM

## 2021-09-14 NOTE — Telephone Encounter (Signed)
-----   Message from Ophelia Shoulder, [place] sent at 09/14/2021  4:23 PM EDT ----- Hulen Skains and spoke with the patient and she stated that she would prefer [facility], but she said that she would travel to [place] for whichever one would be quicker. Patient stated understanding and appreciation for call.

## 2021-09-14 NOTE — Telephone Encounter (Signed)
Patient was called and completed encounter on other encounter.

## 2021-09-14 NOTE — Telephone Encounter (Signed)
Order done for MRI to image kidney cyst  Pt will expect a call to set this up

## 2021-09-24 ENCOUNTER — Ambulatory Visit
Admission: RE | Admit: 2021-09-24 | Discharge: 2021-09-24 | Disposition: A | Discharge status: Home / Self Care | Payer: Medicare PPO | Source: Ambulatory Visit | Attending: Family Medicine | Admitting: Family Medicine

## 2021-09-24 DIAGNOSIS — N281 Cyst of kidney, acquired: Secondary | ICD-10-CM | POA: Diagnosis not present

## 2021-09-24 MED ORDER — GADOBUTROL 1 MMOL/ML IV SOLN
6.0000 mL | Freq: Once | INTRAVENOUS | Status: AC | PRN
  Administered 2021-09-24: 6 mL via INTRAVENOUS

## 2021-10-25 ENCOUNTER — Other Ambulatory Visit: Payer: Self-pay | Admitting: Family Medicine

## 2021-10-25 DIAGNOSIS — Z1231 Encounter for screening mammogram for malignant neoplasm of breast: Secondary | ICD-10-CM

## 2021-10-28 ENCOUNTER — Other Ambulatory Visit: Payer: Self-pay

## 2021-10-28 ENCOUNTER — Ambulatory Visit
Admission: RE | Admit: 2021-10-28 | Discharge: 2021-10-28 | Disposition: A | Discharge status: Home / Self Care | Payer: Medicare PPO | Source: Ambulatory Visit | Attending: Family Medicine | Admitting: Family Medicine

## 2021-10-28 DIAGNOSIS — Z1231 Encounter for screening mammogram for malignant neoplasm of breast: Secondary | ICD-10-CM

## 2022-05-26 DIAGNOSIS — L821 Other seborrheic keratosis: Secondary | ICD-10-CM | POA: Diagnosis not present

## 2022-05-26 DIAGNOSIS — D2272 Melanocytic nevi of left lower limb, including hip: Secondary | ICD-10-CM | POA: Diagnosis not present

## 2022-05-26 DIAGNOSIS — L578 Other skin changes due to chronic exposure to nonionizing radiation: Secondary | ICD-10-CM | POA: Diagnosis not present

## 2022-05-26 DIAGNOSIS — D225 Melanocytic nevi of trunk: Secondary | ICD-10-CM | POA: Diagnosis not present

## 2022-05-26 DIAGNOSIS — D2271 Melanocytic nevi of right lower limb, including hip: Secondary | ICD-10-CM | POA: Diagnosis not present

## 2022-05-26 DIAGNOSIS — D2262 Melanocytic nevi of left upper limb, including shoulder: Secondary | ICD-10-CM | POA: Diagnosis not present

## 2022-05-26 DIAGNOSIS — L814 Other melanin hyperpigmentation: Secondary | ICD-10-CM | POA: Diagnosis not present

## 2022-05-26 DIAGNOSIS — D2261 Melanocytic nevi of right upper limb, including shoulder: Secondary | ICD-10-CM | POA: Diagnosis not present

## 2022-05-30 ENCOUNTER — Ambulatory Visit (INDEPENDENT_AMBULATORY_CARE_PROVIDER_SITE_OTHER): Payer: Medicare PPO

## 2022-05-30 VITALS — Ht 65.5 in | Wt 142.0 lb

## 2022-05-30 DIAGNOSIS — Z0000 Encounter for general adult medical examination without abnormal findings: Secondary | ICD-10-CM | POA: Diagnosis not present

## 2022-05-30 NOTE — Patient Instructions (Signed)
Elizabeth Duncan , Thank you for taking time to come for your Medicare Wellness Visit. I appreciate your ongoing commitment to your health goals. Please review the following plan we discussed and let me know if I can assist you in the future.   Screening recommendations/referrals: Colonoscopy: Done 09/18/2019 Repeat every 7 years.  Mammogram: Done 10/28/2021 Repeat annually  Bone Density: Done 02/28/2017 Repeat every 2 years  Recommended yearly ophthalmology/optometry visit for glaucoma screening and checkup Recommended yearly dental visit for hygiene and checkup  Vaccinations: Influenza vaccine: Due Fall 2023. Pneumococcal vaccine: Done 02/10/2017 and 10/30/2018 Tdap vaccine: Done 02/10/2017 Repeat in 10 years  Shingles vaccine: Done 11/13/2015, 06/03/2021 and 09/03/2021.   Covid-19:Done 02/18/2020, 01/23/2020.  Advanced directives: Please bring a copy of your health care power of attorney and living will to the office to be added to your chart at your convenience.   Conditions/risks identified: KEEP UP THE GOOD WORK!!   Next appointment: Follow up in one year for your annual wellness visit 2024.   Preventive Care 39 Years and Older, Female Preventive care refers to lifestyle choices and visits with your health care provider that can promote health and wellness. What does preventive care include? A yearly physical exam. This is also called an annual well check. Dental exams once or twice a year. Routine eye exams. Ask your health care provider how often you should have your eyes checked. Personal lifestyle choices, including: Daily care of your teeth and gums. Regular physical activity. Eating a healthy diet. Avoiding tobacco and drug use. Limiting alcohol use. Practicing safe sex. Taking low-dose aspirin every day. Taking vitamin and mineral supplements as recommended by your health care provider. What happens during an annual well check? The services and screenings done by your health  care provider during your annual well check will depend on your age, overall health, lifestyle risk factors, and family history of disease. Counseling  Your health care provider may ask you questions about your: Alcohol use. Tobacco use. Drug use. Emotional well-being. Home and relationship well-being. Sexual activity. Eating habits. History of falls. Memory and ability to understand (cognition). Work and work Statistician. Reproductive health. Screening  You may have the following tests or measurements: Height, weight, and BMI. Blood pressure. Lipid and cholesterol levels. These may be checked every 5 years, or more frequently if you are over 30 years old. Skin check. Lung cancer screening. You may have this screening every year starting at age 19 if you have a 30-pack-year history of smoking and currently smoke or have quit within the past 15 years. Fecal occult blood test (FOBT) of the stool. You may have this test every year starting at age 34. Flexible sigmoidoscopy or colonoscopy. You may have a sigmoidoscopy every 5 years or a colonoscopy every 10 years starting at age 95. Hepatitis C blood test. Hepatitis B blood test. Sexually transmitted disease (STD) testing. Diabetes screening. This is done by checking your blood sugar (glucose) after you have not eaten for a while (fasting). You may have this done every 1-3 years. Bone density scan. This is done to screen for osteoporosis. You may have this done starting at age 67. Mammogram. This may be done every 1-2 years. Talk to your health care provider about how often you should have regular mammograms. Talk with your health care provider about your test results, treatment options, and if necessary, the need for more tests. Vaccines  Your health care provider may recommend certain vaccines, such as: Influenza vaccine. This is  recommended every year. Tetanus, diphtheria, and acellular pertussis (Tdap, Td) vaccine. You may need a Td  booster every 10 years. Zoster vaccine. You may need this after age 38. Pneumococcal 13-valent conjugate (PCV13) vaccine. One dose is recommended after age 74. Pneumococcal polysaccharide (PPSV23) vaccine. One dose is recommended after age 39. Talk to your health care provider about which screenings and vaccines you need and how often you need them. This information is not intended to replace advice given to you by your health care provider. Make sure you discuss any questions you have with your health care provider. Document Released: 12/25/2015 Document Revised: 08/17/2016 Document Reviewed: 09/29/2015 Elsevier Interactive Patient Education  2017 [place] Prevention in the Home Falls can cause injuries. They can happen to people of all ages. There are many things you can do to make your home safe and to help prevent falls. What can I do on the outside of my home? Regularly fix the edges of walkways and driveways and fix any cracks. Remove anything that might make you trip as you walk through a door, such as a raised step or threshold. Trim any bushes or trees on the path to your home. Use bright outdoor lighting. Clear any walking paths of anything that might make someone trip, such as rocks or tools. Regularly check to see if handrails are loose or broken. Make sure that both sides of any steps have handrails. Any raised decks and porches should have guardrails on the edges. Have any leaves, snow, or ice cleared regularly. Use sand or salt on walking paths during winter. Clean up any spills in your garage right away. This includes oil or grease spills. What can I do in the bathroom? Use night lights. Install grab bars by the toilet and in the tub and shower. Do not use towel bars as grab bars. Use non-skid mats or decals in the tub or shower. If you need to sit down in the shower, use a plastic, non-slip stool. Keep the floor dry. Clean up any water that spills on the floor  as soon as it happens. Remove soap buildup in the tub or shower regularly. Attach bath mats securely with double-sided non-slip rug tape. Do not have throw rugs and other things on the floor that can make you trip. What can I do in the bedroom? Use night lights. Make sure that you have a light by your bed that is easy to reach. Do not use any sheets or blankets that are too big for your bed. They should not hang down onto the floor. Have a firm chair that has side arms. You can use this for support while you get dressed. Do not have throw rugs and other things on the floor that can make you trip. What can I do in the kitchen? Clean up any spills right away. Avoid walking on wet floors. Keep items that you use a lot in easy-to-reach places. If you need to reach something above you, use a strong step stool that has a grab bar. Keep electrical cords out of the way. Do not use floor polish or wax that makes floors slippery. If you must use wax, use non-skid floor wax. Do not have throw rugs and other things on the floor that can make you trip. What can I do with my stairs? Do not leave any items on the stairs. Make sure that there are handrails on both sides of the stairs and use them. Fix handrails that are  broken or loose. Make sure that handrails are as long as the stairways. Check any carpeting to make sure that it is firmly attached to the stairs. Fix any carpet that is loose or worn. Avoid having throw rugs at the top or bottom of the stairs. If you do have throw rugs, attach them to the floor with carpet tape. Make sure that you have a light switch at the top of the stairs and the bottom of the stairs. If you do not have them, ask someone to add them for you. What else can I do to help prevent falls? Wear shoes that: Do not have high heels. Have rubber bottoms. Are comfortable and fit you well. Are closed at the toe. Do not wear sandals. If you use a stepladder: Make sure that it is  fully opened. Do not climb a closed stepladder. Make sure that both sides of the stepladder are locked into place. Ask someone to hold it for you, if possible. Clearly mark and make sure that you can see: Any grab bars or handrails. First and last steps. Where the edge of each step is. Use tools that help you move around (mobility aids) if they are needed. These include: Canes. Walkers. Scooters. Crutches. Turn on the lights when you go into a dark area. Replace any light bulbs as soon as they burn out. Set up your furniture so you have a clear path. Avoid moving your furniture around. If any of your floors are uneven, fix them. If there are any pets around you, be aware of where they are. Review your medicines with your doctor. Some medicines can make you feel dizzy. This can increase your chance of falling. Ask your doctor what other things that you can do to help prevent falls. This information is not intended to replace advice given to you by your health care provider. Make sure you discuss any questions you have with your health care provider. Document Released: 09/24/2009 Document Revised: 05/05/2016 Document Reviewed: 01/02/2015 Elsevier Interactive Patient Education  2017 [employer].

## 2022-05-30 NOTE — Progress Notes (Signed)
Subjective:   Elizabeth Duncan is a 71 y.o. female who presents for Medicare Annual (Subsequent) preventive examination. Virtual Visit via Telephone Note  I connected with  Elizabeth Duncan on 05/30/22 at  2:45 PM EDT by telephone and verified that I am speaking with the correct person using two identifiers.  Location: Patient: HOME Provider: [facility] Persons participating in the virtual visit: patient/Nurse Health Advisor   I discussed the limitations, risks, security and privacy concerns of performing an evaluation and management service by telephone and the availability of in person appointments. The patient expressed understanding and agreed to proceed.  Interactive audio and video telecommunications were attempted between this nurse and patient, however failed, due to patient having technical difficulties OR patient did not have access to video capability.  We continued and completed visit with audio only.  Some vital signs may be absent or patient reported.   Chriss Driver, LPN  Review of Systems     Cardiac Risk Factors include: advanced age (>46mn, >>89women);sedentary lifestyle     Objective:    Today's Vitals   05/30/22 1443  Weight: 142 lb (64.4 kg)  Height: 5' 5.5" (1.664 m)   Body mass index is 23.27 kg/m.     05/30/2022    2:49 PM 05/27/2021    2:52 PM 07/12/2017   11:03 AM 03/16/2017    6:45 AM  Advanced Directives  Does Patient Have a Medical Advance Directive? Yes Yes Yes Yes  Type of AParamedicof [place] will [place] Does patient want to make changes to medical advance directive?   No - Patient declined No - Patient declined  Copy of [place] Chart? No - copy requested No - copy requested No - copy requested No - copy requested    Current Medications (verified) Outpatient  Encounter Medications as of 05/30/2022  Medication Sig   tretinoin (RETIN-A) 0.025 % cream SMARTSIG:sparingly Topical Every Night   No facility-administered encounter medications on file as of 05/30/2022.    Allergies (verified) Amoxicillin   History: Past Medical History:  Diagnosis Date   Congenital vascular malformation    hemangiomas being treated with laser surgery by DAzzie Roup(Dr. BKalman Shan   History of anemia    History of diverticulosis    History of pelvic mass    History of urinary incontinence    Intention tremor    Past Surgical History:  Procedure Laterality Date   bone spur  2008   Rt 5th toe removed   CARDIOVASCULAR STRESS TEST  2005   normal stress test per pt   COLONOSCOPY  04/17/2009   ESOPHAGOGASTRODUODENOSCOPY     gastritis   EXCISION PARTIAL PHALANX Left 03/16/2017   Procedure: Part excision bone phalanx of left 5th;  Surgeon: MAlbertine Patricia DPM;  [place]  Service: Podiatry;  Laterality: Left;  IVA local   laser treatment of birth mark     @ [facility] (right leg)   Family History  Problem Relation Age of Onset   Alzheimer's disease Mother    Stroke Father        x2   Heart failure Father    Kidney Stones Brother    Diabetes Brother    Bipolar disorder Sister    Colon polyps Sister    Cancer Maternal Grandmother        breast cancer  Breast cancer Maternal Grandmother    Colon cancer Neg Hx    Esophageal cancer Neg Hx    Rectal cancer Neg Hx    Stomach cancer Neg Hx    Social History   Socioeconomic History   Marital status: Married    Spouse name: Not on file   Number of children: Not on file   Years of education: Not on file   Highest education level: Not on file  Occupational History   Not on file  Tobacco Use   Smoking status: Never   Smokeless tobacco: Never  Vaping Use   Vaping Use: Never used  Substance and Sexual Activity   Alcohol use: No    Alcohol/week: 0.0 standard drinks of alcohol   Drug use: No    Sexual activity: Not on file  Other Topics Concern   Not on file  Social History Narrative   Not on file   Social Determinants of Health   Financial Resource Strain: Low Risk  (05/30/2022)   Overall Financial Resource Strain (CARDIA)    Difficulty of Paying Living Expenses: Not hard at all  Food Insecurity: No Food Insecurity (05/30/2022)   Hunger Vital Sign    Worried About Running Out of Food in the Last Year: Never true    Ran Out of Food in the Last Year: Never true  Transportation Needs: No Transportation Needs (05/30/2022)   PRAPARE - Hydrologist (Medical): No    Lack of Transportation (Non-Medical): No  Physical Activity: Sufficiently Active (05/30/2022)   Exercise Vital Sign    Days of Exercise per Week: 5 days    Minutes of Exercise per Session: 30 min  Stress: No Stress Concern Present (05/30/2022)   [place]    Feeling of Stress : Not at all  Social Connections: [place] (05/30/2022)   Social Connection and Isolation Panel [NHANES]    Frequency of Communication with Friends and Family: More than three times a week    Frequency of Social Gatherings with Friends and Family: More than three times a week    Attends Religious Services: More than 4 times per year    Active Member of [employer] or Organizations: Yes    Attends Music therapist: More than 4 times per year    Marital Status: Married    Tobacco Counseling Counseling given: Not Answered   Clinical Intake:  Pre-visit preparation completed: Yes  Pain : No/denies pain     BMI - recorded: 23.27 Nutritional Status: BMI of 19-24  Normal Nutritional Risks: None Diabetes: No  How often do you need to have someone help you when you read instructions, pamphlets, or other written materials from your doctor or pharmacy?: 1 - Never  Diabetic?NO  Interpreter Needed?: No  Information entered by  :: mj Natia Fahmy, lpn   Activities of Daily Living    05/30/2022    2:52 PM  In your present state of health, do you have any difficulty performing the following activities:  Hearing? 0  Vision? 0  Difficulty concentrating or making decisions? 0  Walking or climbing stairs? 0  Dressing or bathing? 0  Doing errands, shopping? 0  Preparing Food and eating ? N  Using the Toilet? N  In the past six months, have you accidently leaked urine? N  Do you have problems with loss of bowel control? N  Managing your Medications? N  Managing your Finances? N  Housekeeping or managing your Housekeeping? N    Patient Care Team: Tower, Wynelle Fanny, MD as PCP - General (Family Medicine)  Indicate any recent Medical Services you may have received from other than [facility] providers in the past year (date may be approximate).     Assessment:   This is a routine wellness examination for [place].  Hearing/Vision screen Hearing Screening - Comments:: Some hearing issues due to back ground noise.  Vision Screening - Comments:: Glasses. Dr. Joya San. 01/2022.  Dietary issues and exercise activities discussed: Current Exercise Habits: Home exercise routine, Type of exercise: walking;strength training/weights, Time (Minutes): 30, Frequency (Times/Week): 5, Weekly Exercise (Minutes/Week): 150, Intensity: Mild, Exercise limited by: cardiac condition(s)   Goals Addressed             This Visit's Progress    Patient Stated   On track    05/27/2021, I will continue aerobics and weight lifting 5-6 days for 35 minutes.        Depression Screen    05/30/2022    2:46 PM 05/27/2021    2:53 PM 11/04/2019   10:15 AM 10/30/2018    8:37 AM 07/12/2017   10:25 AM 01/21/2013   10:29 AM  PHQ 2/9 Scores  PHQ - 2 Score 0 0 0 0 0 0  PHQ- 9 Score  0        Fall Risk    05/30/2022    2:50 PM 05/27/2021    2:53 PM 11/04/2019   10:15 AM 10/30/2018    8:37 AM 07/12/2017   10:25 AM  [place] in the past year? 1 0  0 0 No  Number falls in past yr: 0 0  0   Injury with Fall? 1 0     Risk for fall due to : History of fall(s);Impaired balance/gait No Fall Risks     Follow up Falls prevention discussed Falls evaluation completed;Falls prevention discussed Falls evaluation completed      FALL RISK PREVENTION PERTAINING TO THE HOME:  Any stairs in or around the home? Yes  If so, are there any without handrails? No  Home free of loose throw rugs in walkways, pet beds, electrical cords, etc? Yes  Adequate lighting in your home to reduce risk of falls? Yes   ASSISTIVE DEVICES UTILIZED TO PREVENT FALLS:  Life alert? No  Use of a cane, walker or w/c? No  Grab bars in the bathroom? No  Shower chair or bench in shower? No  Elevated toilet seat or a handicapped toilet? No   TIMED UP AND GO:  Was the test performed? No . Phone visit.   Cognitive Function:    05/27/2021    2:59 PM  MMSE - Mini Mental State Exam  Not completed: Refused        05/30/2022    2:53 PM  6CIT Screen  What Year? 0 points  What month? 0 points  What time? 0 points  Count back from 20 0 points  Months in reverse 0 points  Repeat phrase 0 points  Total Score 0 points    Immunizations Immunization History  Administered Date(s) Administered   Fluad Quad(high Dose 65+) 08/07/2019, 10/08/2020   Influenza Whole 10/12/2001, 09/16/2010   Influenza, High Dose Seasonal PF 08/18/2017   Influenza,inj,Quad PF,6+ Mos 08/26/2014, 08/18/2016, 10/30/2018   Influenza-Unspecified 10/13/2015   PFIZER(Purple Top)SARS-COV-2 Vaccination 01/23/2020, 02/18/2020   PPD Test 02/10/2017   Pneumococcal Conjugate-13 02/10/2017   Pneumococcal Polysaccharide-23 10/30/2018   Td  12/13/1993, 07/17/2007   Tdap 02/10/2017   Zoster, Live 11/13/2015    TDAP status: Up to date  Flu Vaccine status: Up to date  Pneumococcal vaccine status: Up to date  Covid-19 vaccine status: Completed vaccines  Qualifies for Shingles Vaccine? Yes   Zostavax  completed Yes   Shingrix Completed?: Yes  Screening Tests Health Maintenance  Topic Date Due   Hepatitis C Screening  Never done   Zoster Vaccines- Shingrix (1 of 2) Never done   COVID-19 Vaccine (3 - Pfizer risk series) 03/17/2020   INFLUENZA VACCINE  07/12/2022   MAMMOGRAM  10/28/2022   COLONOSCOPY (Pts 45-31yr Insurance coverage will need to be confirmed)  09/17/2026   TETANUS/TDAP  02/11/2027   Pneumonia Vaccine 70 Years old  Completed   DEXA SCAN  Completed   HPV VACCINES  Aged Out    Health Maintenance  Health Maintenance Due  Topic Date Due   Hepatitis C Screening  Never done   Zoster Vaccines- Shingrix (1 of 2) Never done   COVID-19 Vaccine (3 - Pfizer risk series) 03/17/2020    Colorectal cancer screening: Type of screening: Colonoscopy. Completed 09/18/2019. Repeat every 7 years  Mammogram status: Completed 10/28/2021. Repeat every year  Bone Density status: Completed 02/28/2017. Results reflect: Bone density results: NORMAL. Repeat every 2 years.  Lung Cancer Screening: (Low Dose CT Chest recommended if Age 674-80years, 30 pack-year currently smoking OR have quit w/in 15years.) does not qualify.   Additional Screening:  Hepatitis C Screening: does qualify; Completed DUE  Vision Screening: Recommended annual ophthalmology exams for early detection of glaucoma and other disorders of the eye. Is the patient up to date with their annual eye exam?  Yes  Who is the provider or what is the name of the office in which the patient attends annual eye exams[facility] not established with a provider, would they like to be referred to a provider to establish care? No .   Dental Screening: Recommended annual dental exams for proper oral hygiene  Community Resource Referral / Chronic Care Management: CRR required this visit?  No   CCM required this visit?  No      Plan:     I have personally reviewed and noted the following in the patient's chart:    Medical and social history Use of alcohol, tobacco or illicit drugs  Current medications and supplements including opioid prescriptions.  Functional ability and status Nutritional status Physical activity Advanced directives List of other physicians Hospitalizations, surgeries, and ER visits in previous 12 months Vitals Screenings to include cognitive, depression, and falls Referrals and appointments  In addition, I have reviewed and discussed with patient certain preventive protocols, quality metrics, and best practice recommendations. A written personalized care plan for preventive services as well as general preventive health recommendations were provided to patient.     MChriss Driver LPN   [id number]  Nurse Notes: Pt is up to date on vaccines and health maintenance.

## 2022-08-18 ENCOUNTER — Telehealth: Payer: Self-pay | Admitting: Family Medicine

## 2022-08-18 DIAGNOSIS — E785 Hyperlipidemia, unspecified: Secondary | ICD-10-CM | POA: Insufficient documentation

## 2022-08-18 DIAGNOSIS — Z0000 Encounter for general adult medical examination without abnormal findings: Secondary | ICD-10-CM

## 2022-08-18 DIAGNOSIS — E7800 Pure hypercholesterolemia, unspecified: Secondary | ICD-10-CM

## 2022-08-18 NOTE — Telephone Encounter (Signed)
-----   Message from Ellamae Sia sent at 08/11/2022  8:40 AM EDT ----- Regarding: Lab orders for Friday, 9.8.23 Patient is scheduled for CPX labs, please order future labs, Thanks , Karna Christmas

## 2022-08-19 ENCOUNTER — Other Ambulatory Visit (INDEPENDENT_AMBULATORY_CARE_PROVIDER_SITE_OTHER): Payer: Medicare PPO

## 2022-08-19 DIAGNOSIS — E7800 Pure hypercholesterolemia, unspecified: Secondary | ICD-10-CM

## 2022-08-19 DIAGNOSIS — Z0000 Encounter for general adult medical examination without abnormal findings: Secondary | ICD-10-CM

## 2022-08-19 LAB — CBC WITH DIFFERENTIAL/PLATELET
Basophils Absolute: 0 10*3/uL (ref 0.0–0.1)
Basophils Relative: 1.1 % (ref 0.0–3.0)
Eosinophils Absolute: 0.1 10*3/uL (ref 0.0–0.7)
Eosinophils Relative: 3.3 % (ref 0.0–5.0)
HCT: 39.1 % (ref 36.0–46.0)
Hemoglobin: 13.3 g/dL (ref 12.0–15.0)
Lymphocytes Relative: 33.3 % (ref 12.0–46.0)
Lymphs Abs: 1.3 10*3/uL (ref 0.7–4.0)
MCHC: 34.1 g/dL (ref 30.0–36.0)
MCV: 95 fl (ref 78.0–100.0)
Monocytes Absolute: 0.3 10*3/uL (ref 0.1–1.0)
Monocytes Relative: 7.8 % (ref 3.0–12.0)
Neutro Abs: 2.2 10*3/uL (ref 1.4–7.7)
Neutrophils Relative %: 54.5 % (ref 43.0–77.0)
Platelets: 206 10*3/uL (ref 150.0–400.0)
RBC: 4.11 Mil/uL (ref 3.87–5.11)
RDW: 14.1 % (ref 11.5–15.5)
WBC: 4 10*3/uL (ref 4.0–10.5)

## 2022-08-19 LAB — COMPREHENSIVE METABOLIC PANEL
ALT: 17 U/L (ref 0–35)
AST: 22 U/L (ref 0–37)
Albumin: 3.8 g/dL (ref 3.5–5.2)
Alkaline Phosphatase: 49 U/L (ref 39–117)
BUN: 13 mg/dL (ref 6–23)
CO2: 30 mEq/L (ref 19–32)
Calcium: 9.1 mg/dL (ref 8.4–10.5)
Chloride: 106 mEq/L (ref 96–112)
Creatinine, Ser: 0.76 mg/dL (ref 0.40–1.20)
GFR: 79.26 mL/min (ref >60.00)
Glucose, Bld: 85 mg/dL (ref 70–99)
Potassium: 4.6 mEq/L (ref 3.5–5.1)
Sodium: 141 mEq/L (ref 135–145)
Total Bilirubin: 0.8 mg/dL (ref 0.2–1.2)
Total Protein: 6.2 g/dL (ref 6.0–8.3)

## 2022-08-19 LAB — LIPID PANEL
Cholesterol: 205 mg/dL — ABNORMAL HIGH (ref 0–200)
HDL: 66.8 mg/dL (ref >39.00)
LDL Cholesterol: 122 mg/dL — ABNORMAL HIGH (ref 0–99)
NonHDL: 138.62
Total CHOL/HDL Ratio: 3
Triglycerides: 82 mg/dL (ref 0.0–149.0)
VLDL: 16.4 mg/dL (ref 0.0–40.0)

## 2022-08-19 LAB — TSH: TSH: 3.01 u[IU]/mL (ref 0.35–5.50)

## 2022-08-26 ENCOUNTER — Ambulatory Visit (INDEPENDENT_AMBULATORY_CARE_PROVIDER_SITE_OTHER): Payer: Medicare PPO | Admitting: Family Medicine

## 2022-08-26 ENCOUNTER — Encounter: Payer: Self-pay | Admitting: Family Medicine

## 2022-08-26 VITALS — BP 128/78 | HR 60 | Temp 97.7°F | Ht 65.25 in | Wt 147.1 lb

## 2022-08-26 DIAGNOSIS — Z23 Encounter for immunization: Secondary | ICD-10-CM | POA: Diagnosis not present

## 2022-08-26 DIAGNOSIS — E7800 Pure hypercholesterolemia, unspecified: Secondary | ICD-10-CM | POA: Diagnosis not present

## 2022-08-26 DIAGNOSIS — E2839 Other primary ovarian failure: Secondary | ICD-10-CM | POA: Diagnosis not present

## 2022-08-26 DIAGNOSIS — Z1231 Encounter for screening mammogram for malignant neoplasm of breast: Secondary | ICD-10-CM | POA: Diagnosis not present

## 2022-08-26 DIAGNOSIS — Z0000 Encounter for general adult medical examination without abnormal findings: Secondary | ICD-10-CM | POA: Diagnosis not present

## 2022-08-26 NOTE — Assessment & Plan Note (Signed)
Lipids are fair/stable Disc goals for lipids and reasons to control them Rev last labs with pt Rev low sat fat diet in detail Disc diet to help get LDL lower

## 2022-08-26 NOTE — Assessment & Plan Note (Signed)
Reviewed health habits including diet and exercise and skin cancer prevention Reviewed appropriate screening tests for age  Also reviewed health mt list, fam hx and immunization status , as well as social and family history   See HPI Labs reviewed  Enc to keep walking  Flu shot today  Mammogram and dexa ordered No falls or fx -takes ca and  D Colonoscopy utd 2/18 with 7 y recall

## 2022-08-26 NOTE — Progress Notes (Signed)
Subjective:    Patient ID: Elizabeth Duncan, female    DOB: 1951/05/15, 71 y.o.   MRN: [id number]  HPI Here for health maintenance exam and to review chronic medical problems    Wt Readings from Last 3 Encounters:  08/26/22 147 lb 2 oz (66.7 kg)  05/30/22 142 lb (64.4 kg)  08/12/21 142 lb 8 oz (64.6 kg)   24.30 kg/m  Doing well  Just went to [place] and did a river cruise  Time with family   Did gain weight on vacation -working on that   Exercise 5-6 d per week -weights  Walking some days    Immunization History  Administered Date(s) Administered   Fluad Quad(high Dose 65+) 08/07/2019, 10/08/2020   Influenza Whole 10/12/2001, 09/16/2010   Influenza, High Dose Seasonal PF 08/18/2017   Influenza,inj,Quad PF,6+ Mos 08/26/2014, 08/18/2016, 10/30/2018   Influenza-Unspecified 10/13/2015   PFIZER(Purple Top)SARS-COV-2 Vaccination 01/23/2020, 02/18/2020   PPD Test 02/10/2017   Pneumococcal Conjugate-13 02/10/2017   Pneumococcal Polysaccharide-23 10/30/2018   Td 12/13/1993, 07/17/2007   Tdap 02/10/2017   Zoster Recombinat (Shingrix) 06/03/2021, 09/03/2021   Zoster, Live 11/13/2015   Health Maintenance Due  Topic Date Due   Hepatitis C Screening  Never done   COVID-19 Vaccine (3 - Pfizer risk series) 03/17/2020   INFLUENZA VACCINE  07/12/2022   Flu shot -today   Mammogram 10/2021 at the breast center  Self breast exam: no lumps  MGM had breast cancer  Colonoscopy 09/2019 with 7 y recall   Dexa  nl in 2018  at med center [place]: none Fractures:none  Supplements  ca and D Exercise : regular    BP Readings from Last 3 Encounters:  08/26/22 128/78  08/12/21 136/72  06/03/21 124/82   Pulse Readings from Last 3 Encounters:  08/26/22 60  08/12/21 80  06/03/21 69   Lab Results  Component Value Date   CREATININE 0.76 08/19/2022   BUN 13 08/19/2022   NA 141 08/19/2022   K 4.6 08/19/2022   CL 106 08/19/2022   CO2 30 08/19/2022     Hyperlipidemia Lab Results  Component Value Date   CHOL 205 (H) 08/19/2022   CHOL 199 05/28/2021   CHOL 219 (H) 11/04/2019   Lab Results  Component Value Date   HDL 66.80 08/19/2022   HDL 67.30 05/28/2021   HDL 67.80 11/04/2019   Lab Results  Component Value Date   LDLCALC 122 (H) 08/19/2022   LDLCALC 119 (H) 05/28/2021   LDLCALC 136 (H) 11/04/2019   Lab Results  Component Value Date   TRIG 82.0 08/19/2022   TRIG 65.0 05/28/2021   TRIG 77.0 11/04/2019   Lab Results  Component Value Date   CHOLHDL 3 08/19/2022   CHOLHDL 3 05/28/2021   CHOLHDL 3 11/04/2019   No results found for: "LDLDIRECT" Diet controlled   Watches diet  Not a lot of red meat or fried foods (not often) Some sweets   Lab Results  Component Value Date   ALT 17 08/19/2022   AST 22 08/19/2022   ALKPHOS 49 08/19/2022   BILITOT 0.8 08/19/2022   Lab Results  Component Value Date   WBC 4.0 08/19/2022   HGB 13.3 08/19/2022   HCT 39.1 08/19/2022   MCV 95.0 08/19/2022   PLT 206.0 08/19/2022   Lab Results  Component Value Date   TSH 3.01 08/19/2022   Patient Active Problem List   Diagnosis Date Noted   Encounter for screening mammogram for breast cancer  08/26/2022   Hyperlipidemia 08/18/2022   Cyst of left kidney 08/13/2021   Right flank pain 08/13/2021   Microscopic hematuria 08/13/2021   Abdominal pain, RUQ 08/12/2021   Nausea 08/12/2021   Medicare annual wellness visit, subsequent 11/04/2019   Estrogen deficiency 02/10/2017   Actinic keratoses 11/19/2013   Congenital vascular nevus 11/19/2013   Routine general medical examination at a health care facility 01/21/2013   Stress incontinence in female 01/21/2013   Hemangioma 05/17/2012   INTENTION TREMOR 07/17/2007   Past Medical History:  Diagnosis Date   Congenital vascular malformation    hemangiomas being treated with laser surgery by Azzie Roup (Dr. Kalman Shan)   History of anemia    History of diverticulosis    History of  pelvic mass    History of urinary incontinence    Intention tremor    Past Surgical History:  Procedure Laterality Date   bone spur  2008   Rt 5th toe removed   CARDIOVASCULAR STRESS TEST  2005   normal stress test per pt   COLONOSCOPY  04/17/2009   ESOPHAGOGASTRODUODENOSCOPY     gastritis   EXCISION PARTIAL PHALANX Left 03/16/2017   Procedure: Part excision bone phalanx of left 5th;  Surgeon: Albertine Patricia, DPM;  Location: [facility];  Service: Podiatry;  Laterality: Left;  IVA local   laser treatment of birth mark     @ [facility] (right leg)   Social History   Tobacco Use   Smoking status: Never   Smokeless tobacco: Never  Vaping Use   Vaping Use: Never used  Substance Use Topics   Alcohol use: No    Alcohol/week: 0.0 standard drinks of alcohol   Drug use: No   Family History  Problem Relation Age of Onset   Alzheimer's disease Mother    Stroke Father        x2   Heart failure Father    Kidney Stones Brother    Diabetes Brother    Bipolar disorder Sister    Colon polyps Sister    Cancer Maternal Grandmother        breast cancer   Breast cancer Maternal Grandmother    Colon cancer Neg Hx    Esophageal cancer Neg Hx    Rectal cancer Neg Hx    Stomach cancer Neg Hx    Allergies  Allergen Reactions   Amoxicillin Rash        Current Outpatient Medications on File Prior to Visit  Medication Sig Dispense Refill   Multiple Vitamins-Minerals (CENTRUM SILVER 50+WOMEN PO) Take 1 Capful by mouth daily.     tretinoin (RETIN-A) 0.025 % cream SMARTSIG:sparingly Topical Every Night     No current facility-administered medications on file prior to visit.      Review of Systems  Constitutional:  Negative for activity change, appetite change, fatigue, fever and unexpected weight change.  HENT:  Negative for congestion, ear pain, rhinorrhea, sinus pressure and sore throat.   Eyes:  Negative for pain, redness and visual disturbance.  Respiratory:  Negative for  cough, shortness of breath and wheezing.   Cardiovascular:  Negative for chest pain and palpitations.  Gastrointestinal:  Negative for abdominal pain, blood in stool, constipation and diarrhea.  Endocrine: Negative for polydipsia and polyuria.  Genitourinary:  Negative for dysuria, frequency and urgency.  Musculoskeletal:  Negative for arthralgias, back pain and myalgias.  Skin:  Negative for pallor and rash.  Allergic/Immunologic: Negative for environmental allergies.  Neurological:  Negative for dizziness, syncope and  headaches.  Hematological:  Negative for adenopathy. Does not bruise/bleed easily.  Psychiatric/Behavioral:  Negative for decreased concentration and dysphoric mood. The patient is not nervous/anxious.        Objective:   Physical Exam Constitutional:      General: She is not in acute distress.    Appearance: Normal appearance. She is well-developed and normal weight. She is not ill-appearing or diaphoretic.  HENT:     Head: Normocephalic and atraumatic.     Right Ear: Tympanic membrane, ear canal and external ear normal.     Left Ear: Tympanic membrane, ear canal and external ear normal.     Nose: Nose normal. No congestion.     Mouth/Throat:     Mouth: Mucous membranes are moist.     Pharynx: Oropharynx is clear. No posterior oropharyngeal erythema.  Eyes:     General: No scleral icterus.    Extraocular Movements: Extraocular movements intact.     Conjunctiva/sclera: Conjunctivae normal.     Pupils: Pupils are equal, round, and reactive to light.  Neck:     Thyroid: No thyromegaly.     Vascular: No carotid bruit or JVD.  Cardiovascular:     Rate and Rhythm: Normal rate and regular rhythm.     Pulses: Normal pulses.     Heart sounds: Normal heart sounds.     No gallop.  Pulmonary:     Effort: Pulmonary effort is normal. No respiratory distress.     Breath sounds: Normal breath sounds. No wheezing.     Comments: Good air exch Chest:     Chest wall: No  tenderness.  Abdominal:     General: Bowel sounds are normal. There is no distension or abdominal bruit.     Palpations: Abdomen is soft. There is no mass.     Tenderness: There is no abdominal tenderness.     Hernia: No hernia is present.  Genitourinary:    Comments: Breast exam: No mass, nodules, thickening, tenderness, bulging, retraction, inflamation, nipple discharge or skin changes noted.  No axillary or clavicular LA.     Musculoskeletal:        General: No tenderness. Normal range of motion.     Cervical back: Normal range of motion and neck supple. No rigidity. No muscular tenderness.     Right lower leg: No edema.     Left lower leg: No edema.     Comments: No kyphosis   Lymphadenopathy:     Cervical: No cervical adenopathy.  Skin:    General: Skin is warm and dry.     Coloration: Skin is not pale.     Findings: No erythema or rash.     Comments: Solar lentigines diffusely Some sks  Neurological:     Mental Status: She is alert. Mental status is at baseline.     Cranial Nerves: No cranial nerve deficit.     Motor: No abnormal muscle tone.     Coordination: Coordination normal.     Gait: Gait normal.     Deep Tendon Reflexes: Reflexes are normal and symmetric. Reflexes normal.  Psychiatric:        Mood and Affect: Mood normal.        Cognition and Memory: Cognition and memory normal.           Assessment & Plan:   Problem List Items Addressed This Visit       Other   Encounter for screening mammogram for breast cancer    Mammogram ordered  Pt will call breast center to schedule       Relevant Orders   MM 3D SCREEN BREAST BILATERAL   Estrogen deficiency    dexa ordered for OP screening No falls or fractures   Disc need for calcium/ vitamin D/ wt bearing exercise and bone density test every 2 y to monitor Disc safety/ fracture risk in detail        Relevant Orders   DG Bone Density   Hyperlipidemia    Lipids are fair/stable Disc goals for lipids  and reasons to control them Rev last labs with pt Rev low sat fat diet in detail Disc diet to help get LDL lower       Routine general medical examination at a health care facility - Primary    Reviewed health habits including diet and exercise and skin cancer prevention Reviewed appropriate screening tests for age  Also reviewed health mt list, fam hx and immunization status , as well as social and family history   See HPI Labs reviewed  Enc to keep walking  Flu shot today  Mammogram and dexa ordered No falls or fx -takes ca and  D Colonoscopy utd 2/18 with 7 y recall       Other Visit Diagnoses     Need for influenza vaccination       Relevant Orders   Flu Vaccine QUAD High Dose(Fluad) (Completed)

## 2022-08-26 NOTE — Assessment & Plan Note (Signed)
Mammogram ordered Pt will call breast center to schedule

## 2022-08-26 NOTE — Patient Instructions (Addendum)
Schedule your mammogram for November  Schedule the bone density at the same time if you can    For cholesterol Avoid red meat/ fried foods/ egg yolks/ fatty breakfast meats/ butter, cheese and high fat dairy/ and shellfish    Stay active brain and body  Keep socializing also   Flu shot today

## 2022-08-26 NOTE — Assessment & Plan Note (Signed)
dexa ordered for OP screening No falls or fractures   Disc need for calcium/ vitamin D/ wt bearing exercise and bone density test every 2 y to monitor Disc safety/ fracture risk in detail

## 2022-09-20 ENCOUNTER — Telehealth: Payer: Self-pay | Admitting: Family Medicine

## 2022-09-20 NOTE — Telephone Encounter (Signed)
Pt called stating she started a new job with [employer] & a form for a physical is required. Pt is asking since she recently had her cpe done on 08/26/22, could Dr. Glori Bickers just fill the form out for her? Pt stated the form included some questions about her immunizations records. Call back # [phone]

## 2022-09-20 NOTE — Telephone Encounter (Signed)
I can look at it and see what it requires , If she needs a visit will have to let her know

## 2022-09-20 NOTE — Telephone Encounter (Signed)
Pt notified of Dr. Marliss Coots comments, pt also advise PCP is going out of the state and form wouldn't be ready until next week, pt is okay with that she doesn't need to turn it in until Nov

## 2022-09-22 ENCOUNTER — Telehealth: Payer: Self-pay | Admitting: Family Medicine

## 2022-09-22 NOTE — Telephone Encounter (Signed)
Patient came by and dropped off physical form to be completed. Left up front in Dr Alba Cory box.

## 2022-09-23 NOTE — Telephone Encounter (Signed)
Form in your inbox 

## 2022-09-29 NOTE — Telephone Encounter (Signed)
Left VM letting pt know form ready for pick up 

## 2022-09-29 NOTE — Telephone Encounter (Signed)
Done and in IN box 

## 2022-10-17 ENCOUNTER — Telehealth: Payer: Self-pay | Admitting: Family Medicine

## 2022-10-17 NOTE — Telephone Encounter (Signed)
Patient called in and stated that her job is requiring her to have a TB test done. Informed patient we can get her scheduled once an order is put in. Thank you!

## 2022-10-17 NOTE — Telephone Encounter (Signed)
Tb skin test is what pt needs. Nurse visit scheduled

## 2022-10-17 NOTE — Telephone Encounter (Signed)
TB gold or ppd? She does not have to return for the gold but it can take a while to come back

## 2022-10-19 ENCOUNTER — Ambulatory Visit (INDEPENDENT_AMBULATORY_CARE_PROVIDER_SITE_OTHER): Payer: Medicare PPO

## 2022-10-19 DIAGNOSIS — Z111 Encounter for screening for respiratory tuberculosis: Secondary | ICD-10-CM

## 2022-10-19 NOTE — Progress Notes (Signed)
PPD Placement note Elizabeth Duncan, 71 y.o. female is here today for placement of PPD test Reason for PPD test: work requirment Pt taken PPD test before: yes Verified in allergy area and with patient that they are not allergic to the products PPD is made of (Phenol or Tween). Yes Is patient taking any oral or IV steroid medication now or have they taken it in the last month? no Has the patient ever received the BCG vaccine?: no Has the patient been in recent contact with anyone known or suspected of having active TB disease?: no      Date of exposure (if applicable): NA      Name of person they were exposed to (if applicable): NA Patient's Country of origin?: [place] O: Alert and oriented in NAD. P:  PPD placed on 10/19/2022.  Patient advised to return for reading within 48-72 hours.

## 2022-10-21 LAB — TB SKIN TEST
Induration: 0 mm
TB Skin Test: NEGATIVE

## 2022-10-28 ENCOUNTER — Other Ambulatory Visit (HOSPITAL_COMMUNITY): Payer: Self-pay | Admitting: Family Medicine

## 2022-10-28 DIAGNOSIS — Z1231 Encounter for screening mammogram for malignant neoplasm of breast: Secondary | ICD-10-CM

## 2022-10-31 ENCOUNTER — Ambulatory Visit: Payer: Medicare PPO

## 2022-11-14 ENCOUNTER — Ambulatory Visit (HOSPITAL_COMMUNITY)
Admission: RE | Admit: 2022-11-14 | Discharge: 2022-11-14 | Disposition: A | Discharge status: Home / Self Care | Payer: Medicare PPO | Source: Ambulatory Visit | Attending: Family Medicine | Admitting: Family Medicine

## 2022-11-14 DIAGNOSIS — Z1231 Encounter for screening mammogram for malignant neoplasm of breast: Secondary | ICD-10-CM | POA: Diagnosis not present

## 2022-12-22 ENCOUNTER — Ambulatory Visit: Payer: Medicare PPO

## 2023-01-30 ENCOUNTER — Telehealth: Payer: Self-pay | Admitting: Family Medicine

## 2023-01-30 NOTE — Telephone Encounter (Signed)
LVM for patient to callback and schedule.  

## 2023-01-30 NOTE — Telephone Encounter (Signed)
Form in your inbox for review

## 2023-01-30 NOTE — Telephone Encounter (Signed)
I started working on this  They require things we did not do at her physical     Please f/u for a visit  I will hold on my dexk

## 2023-01-30 NOTE — Telephone Encounter (Signed)
Patient dropped off document to be filled out by provider  physical form . Patient requested to send it via Call Patient to pick up within 2-days. Document is located in providers tray at front office.

## 2023-01-30 NOTE — Telephone Encounter (Signed)
Patient has been scheduled.

## 2023-02-01 ENCOUNTER — Encounter: Payer: Self-pay | Admitting: Family Medicine

## 2023-02-01 ENCOUNTER — Ambulatory Visit (INDEPENDENT_AMBULATORY_CARE_PROVIDER_SITE_OTHER): Payer: Medicare PPO | Admitting: Family Medicine

## 2023-02-01 VITALS — BP 134/78 | HR 80 | Temp 97.9°F | Ht 65.25 in | Wt 145.4 lb

## 2023-02-01 DIAGNOSIS — N390 Urinary tract infection, site not specified: Secondary | ICD-10-CM

## 2023-02-01 DIAGNOSIS — R8290 Unspecified abnormal findings in urine: Secondary | ICD-10-CM | POA: Insufficient documentation

## 2023-02-01 DIAGNOSIS — B951 Streptococcus, group B, as the cause of diseases classified elsewhere: Secondary | ICD-10-CM | POA: Diagnosis not present

## 2023-02-01 DIAGNOSIS — Z021 Encounter for pre-employment examination: Secondary | ICD-10-CM | POA: Insufficient documentation

## 2023-02-01 DIAGNOSIS — Z111 Encounter for screening for respiratory tuberculosis: Secondary | ICD-10-CM

## 2023-02-01 LAB — POC URINALSYSI DIPSTICK (AUTOMATED)
Bilirubin, UA: NEGATIVE
Blood, UA: NEGATIVE
Glucose, UA: NEGATIVE
Ketones, UA: NEGATIVE
Nitrite, UA: NEGATIVE
Protein, UA: NEGATIVE
Spec Grav, UA: 1.02 (ref 1.010–1.025)
Urobilinogen, UA: 0.2 E.U./dL
pH, UA: 6 (ref 5.0–8.0)

## 2023-02-01 NOTE — Assessment & Plan Note (Signed)
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

## 2023-02-01 NOTE — Progress Notes (Signed)
Subjective:    Patient ID: Elizabeth Duncan, female    DOB: [birth year], 72 y.o.   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   Wt Readings from Last 3 Encounters:  02/01/23 145 lb 6 oz (65.9 kg)  08/26/22 147 lb 2 oz (66.7 kg)  05/30/22 142 lb (64.4 kg)   24.01 kg/m  Vitals:   02/01/23 1529  BP: 134/78  Pulse: 80  Temp: 97.9 F (36.6 C)  SpO2: 98%    Planning a foreign mission now -excited about that   May be [place] or [place] - between [employer] and December   Needs vision screen   Vision Screening   Right eye Left eye Both eyes  Without correction     With correction 20/25 20/25 20/15 $     Just had eye exam   Mammogram utd 12/23   Needs TB screen  Has never had BCG   Has seen urology for bladder prolapse  Had micro blood in urine in 2022    Ua Results for orders placed or performed in visit on 02/01/23  POCT Urinalysis Dipstick (Automated)  Result Value Ref Range   Color, UA Yellow    Clarity, UA Clear    Glucose, UA Negative Negative   Bilirubin, UA Negative    Ketones, UA Negative    Spec Grav, UA 1.020 1.010 - 1.025   Blood, UA Negative    pH, UA 6.0 5.0 - 8.0   Protein, UA Negative Negative   Urobilinogen, UA 0.2 0.2 or 1.0 E.U./dL   Nitrite, UA Negative    Leukocytes, UA Small (1+) (A) Negative     Patient Active Problem List   Diagnosis Date Noted   Screening-pulmonary TB 02/01/2023   Physical exam, pre-employment 02/01/2023   Abnormal urinalysis 02/01/2023   Encounter for screening mammogram for breast cancer 08/26/2022   Hyperlipidemia 08/18/2022   Cyst of left kidney 08/13/2021   Right flank pain 08/13/2021   Microscopic hematuria 08/13/2021   Medicare annual wellness visit, subsequent 11/04/2019   Estrogen deficiency 02/10/2017   Actinic keratoses 11/19/2013   Congenital vascular nevus 11/19/2013   Routine general medical examination at a health care facility 01/21/2013   Stress incontinence in  female 01/21/2013   INTENTION TREMOR 07/17/2007   Past Medical History:  Diagnosis Date   Congenital vascular malformation    hemangiomas being treated with laser surgery by Azzie Roup (Dr. Kalman Shan)   History of anemia    History of diverticulosis    History of pelvic mass    History of urinary incontinence    Intention tremor    Past Surgical History:  Procedure Laterality Date   bone spur  2008   Rt 5th toe removed   CARDIOVASCULAR STRESS TEST  2005   normal stress test per pt   COLONOSCOPY  04/17/2009   ESOPHAGOGASTRODUODENOSCOPY     gastritis   EXCISION PARTIAL PHALANX Left 03/16/2017   Procedure: Part excision bone phalanx of left 5th;  Surgeon: Albertine Patricia, DPM;  Location: [place];  Service: Podiatry;  Laterality: Left;  IVA local   laser treatment of birth mark     @ [facility] (right leg)   Social History   Tobacco Use   Smoking status: Never   Smokeless tobacco: Never  Vaping Use   Vaping Use: Never used  Substance Use Topics   Alcohol use: No    Alcohol/week: 0.0 standard drinks of alcohol   Drug  use: No   Family History  Problem Relation Age of Onset   Alzheimer's disease Mother    Stroke Father        x2   Heart failure Father    Kidney Stones Brother    Diabetes Brother    Bipolar disorder Sister    Colon polyps Sister    Cancer Maternal Grandmother        breast cancer   Breast cancer Maternal Grandmother    Colon cancer Neg Hx    Esophageal cancer Neg Hx    Rectal cancer Neg Hx    Stomach cancer Neg Hx    Allergies  Allergen Reactions   Amoxicillin Rash        Current Outpatient Medications on File Prior to Visit  Medication Sig Dispense Refill   Multiple Vitamins-Minerals (CENTRUM SILVER 50+WOMEN PO) Take 1 Capful by mouth daily.     tretinoin (RETIN-A) 0.025 % cream SMARTSIG:sparingly Topical Every Night     No current facility-administered medications on file prior to visit.    Review of Systems  Constitutional:  Negative  for activity change, appetite change, fatigue, fever and unexpected weight change.  HENT:  Negative for congestion, ear pain, rhinorrhea, sinus pressure and sore throat.   Eyes:  Negative for pain, redness and visual disturbance.  Respiratory:  Negative for cough, shortness of breath and wheezing.   Cardiovascular:  Negative for chest pain and palpitations.  Gastrointestinal:  Negative for abdominal pain, blood in stool, constipation and diarrhea.  Endocrine: Negative for polydipsia and polyuria.  Genitourinary:  Negative for dysuria, frequency and urgency.       Some urinary incontinence  Musculoskeletal:  Negative for arthralgias, back pain and myalgias.  Skin:  Negative for pallor and rash.  Allergic/Immunologic: Negative for environmental allergies.  Neurological:  Negative for dizziness, syncope and headaches.  Hematological:  Negative for adenopathy. Does not bruise/bleed easily.  Psychiatric/Behavioral:  Negative for decreased concentration and dysphoric mood. The patient is not nervous/anxious.        Objective:   Physical Exam Constitutional:      General: She is not in acute distress.    Appearance: Normal appearance. She is normal weight. She is not ill-appearing or diaphoretic.  HENT:     Mouth/Throat:     Mouth: Mucous membranes are moist.  Eyes:     General: No scleral icterus.       Right eye: No discharge.        Left eye: No discharge.     Conjunctiva/sclera: Conjunctivae normal.     Pupils: Pupils are equal, round, and reactive to light.  Cardiovascular:     Rate and Rhythm: Normal rate and regular rhythm.  Pulmonary:     Effort: Pulmonary effort is normal. No respiratory distress.     Breath sounds: Normal breath sounds.  Abdominal:     General: There is no distension.  Musculoskeletal:     Cervical back: Normal range of motion.  Skin:    General: Skin is warm and dry.     Findings: No erythema or rash.  Neurological:     Mental Status: She is alert.      Cranial Nerves: No cranial nerve deficit.     Motor: No weakness.     Gait: Gait normal.  Psychiatric:        Mood and Affect: Mood normal.           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

## 2023-02-01 NOTE — Assessment & Plan Note (Signed)
Ua noted small leukocytes No symptoms Sent for culture

## 2023-02-01 NOTE — Patient Instructions (Signed)
Ua today  Lab today for TB screening  Lab today for cbc   Vision screen   I will sign paperwork when the results return

## 2023-02-02 LAB — CBC WITH DIFFERENTIAL/PLATELET
Basophils Absolute: 0.1 10*3/uL (ref 0.0–0.1)
Basophils Relative: 1.3 % (ref 0.0–3.0)
Eosinophils Absolute: 0.1 10*3/uL (ref 0.0–0.7)
Eosinophils Relative: 1.6 % (ref 0.0–5.0)
HCT: 39.9 % (ref 36.0–46.0)
Hemoglobin: 13.4 g/dL (ref 12.0–15.0)
Lymphocytes Relative: 34.2 % (ref 12.0–46.0)
Lymphs Abs: 1.9 10*3/uL (ref 0.7–4.0)
MCHC: 33.6 g/dL (ref 30.0–36.0)
MCV: 95.8 fl (ref 78.0–100.0)
Monocytes Absolute: 0.4 10*3/uL (ref 0.1–1.0)
Monocytes Relative: 6.8 % (ref 3.0–12.0)
Neutro Abs: 3 10*3/uL (ref 1.4–7.7)
Neutrophils Relative %: 56.1 % (ref 43.0–77.0)
Platelets: 231 10*3/uL (ref 150.0–400.0)
RBC: 4.16 Mil/uL (ref 3.87–5.11)
RDW: 13.7 % (ref 11.5–15.5)
WBC: 5.4 10*3/uL (ref 4.0–10.5)

## 2023-02-03 DIAGNOSIS — N390 Urinary tract infection, site not specified: Secondary | ICD-10-CM | POA: Insufficient documentation

## 2023-02-03 LAB — URINE CULTURE
MICRO NUMBER:: 14595301
SPECIMEN QUALITY:: ADEQUATE

## 2023-02-03 LAB — QUANTIFERON-TB GOLD PLUS
Mitogen-NIL: 7.12 IU/mL
NIL: 0.02 IU/mL
QuantiFERON-TB Gold Plus: NEGATIVE
TB1-NIL: 0.01 IU/mL
TB2-NIL: 0 IU/mL

## 2023-02-03 MED ORDER — NITROFURANTOIN MONOHYD MACRO 100 MG PO CAPS: 100.0000 mg | ORAL_CAPSULE | Freq: Two times a day (BID) | ORAL | 0 refills | Status: DC

## 2023-02-03 NOTE — Addendum Note (Signed)
Addended by: Loura Pardon A on: 02/03/2023 03:28 PM   Modules accepted: Orders

## 2023-02-03 NOTE — Assessment & Plan Note (Signed)
Pcn allergic Will tx with macrobid 5 d

## 2023-02-08 ENCOUNTER — Ambulatory Visit
Admission: RE | Admit: 2023-02-08 | Discharge: 2023-02-08 | Disposition: A | Discharge status: Home / Self Care | Payer: Medicare PPO | Source: Ambulatory Visit | Attending: Emergency Medicine | Admitting: Emergency Medicine

## 2023-02-08 ENCOUNTER — Telehealth: Payer: Self-pay | Admitting: Family Medicine

## 2023-02-08 VITALS — BP 111/60 | HR 69 | Temp 98.0°F | Resp 18

## 2023-02-08 DIAGNOSIS — L509 Urticaria, unspecified: Secondary | ICD-10-CM | POA: Diagnosis not present

## 2023-02-08 MED ORDER — CETIRIZINE HCL 10 MG PO TABS: 10.0000 mg | ORAL_TABLET | Freq: Every day | ORAL | 0 refills | Status: DC

## 2023-02-08 MED ORDER — PREDNISONE 10 MG (21) PO TBPK: ORAL_TABLET | Freq: Every day | ORAL | 0 refills | Status: DC

## 2023-02-08 NOTE — ED Provider Notes (Signed)
[facility]    CSN: [id number] Arrival date & time: 02/08/23  1815      History   Chief Complaint Chief Complaint  Patient presents with   Medication Reaction    has whelps and itching upper body; no difficulty in breathing and no swelling neck, throat, mouth or tongue. - Entered by patient    HPI Elizabeth Duncan is a 72 y.o. female.  Patient presents with hives on her arms and neck since yesterday.  She attributes this to [facility] that she has been taking since 02/03/2023; last taken yesterday evening.  No new products or foods.  No other new medications.  She denies difficulty swallowing or breathing.  No treatment at home.  Patient contacted her PCP and was instructed to stop the Macrobid today.   The history is provided by the patient and medical records.    Past Medical History:  Diagnosis Date   Congenital vascular malformation    hemangiomas being treated with laser surgery by Azzie Roup (Dr. Kalman Shan)   History of anemia    History of diverticulosis    History of pelvic mass    History of urinary incontinence    Intention tremor     Patient Active Problem List   Diagnosis Date Noted   Group B streptococcal UTI 02/03/2023   Screening-pulmonary TB 02/01/2023   Physical exam, pre-employment 02/01/2023   Abnormal urinalysis 02/01/2023   Encounter for screening mammogram for breast cancer 08/26/2022   Hyperlipidemia 08/18/2022   Cyst of left kidney 08/13/2021   Right flank pain 08/13/2021   Microscopic hematuria 08/13/2021   Medicare annual wellness visit, subsequent 11/04/2019   Estrogen deficiency 02/10/2017   Actinic keratoses 11/19/2013   Congenital vascular nevus 11/19/2013   Routine general medical examination at a health care facility 01/21/2013   Stress incontinence in female 01/21/2013   INTENTION TREMOR 07/17/2007    Past Surgical History:  Procedure Laterality Date   bone spur  2008   Rt 5th toe removed   CARDIOVASCULAR STRESS TEST   2005   normal stress test per pt   COLONOSCOPY  04/17/2009   ESOPHAGOGASTRODUODENOSCOPY     gastritis   EXCISION PARTIAL PHALANX Left 03/16/2017   Procedure: Part excision bone phalanx of left 5th;  Surgeon: Albertine Patricia, DPM;  Location: [place];  Service: Podiatry;  Laterality: Left;  IVA local   laser treatment of birth mark     @ [facility] (right leg)    OB History   No obstetric history on file.      Home Medications    Prior to Admission medications   Medication Sig Start Date End Date Taking? Authorizing Provider  cetirizine (ZYRTEC ALLERGY) 10 MG tablet Take 1 tablet (10 mg total) by mouth daily. 02/08/23  Yes Sharion Balloon, NP  predniSONE (STERAPRED UNI-PAK 21 TAB) 10 MG (21) TBPK tablet Take by mouth daily. As directed 02/08/23  Yes Sharion Balloon, NP  Multiple Vitamins-Minerals (CENTRUM SILVER 50+WOMEN PO) Take 1 Capful by mouth daily.    [provider]  nitrofurantoin, macrocrystal-monohydrate, (MACROBID) 100 MG capsule Take 1 capsule (100 mg total) by mouth 2 (two) times daily. Patient not taking: Reported on 02/08/2023 02/03/23   Abner Greenspan, MD  tretinoin (RETIN-A) 0.025 % cream SMARTSIG:sparingly Topical Every Night 05/26/22   [provider]    Family History Family History  Problem Relation Age of Onset   Alzheimer's disease Mother    Stroke Father  x2   Heart failure Father    Kidney Stones Brother    Diabetes Brother    Bipolar disorder Sister    Colon polyps Sister    Cancer Maternal Grandmother        breast cancer   Breast cancer Maternal Grandmother    Colon cancer Neg Hx    Esophageal cancer Neg Hx    Rectal cancer Neg Hx    Stomach cancer Neg Hx     Social History Social History   Tobacco Use   Smoking status: Never   Smokeless tobacco: Never  Vaping Use   Vaping Use: Never used  Substance Use Topics   Alcohol use: No    Alcohol/week: 0.0 standard drinks of alcohol   Drug use: No     Allergies    Amoxicillin and Macrobid [nitrofurantoin]   Review of Systems Review of Systems  Constitutional:  Negative for chills and fever.  HENT:  Negative for sore throat, trouble swallowing and voice change.   Respiratory:  Negative for cough and shortness of breath.   Cardiovascular:  Negative for chest pain and palpitations.  Skin:  Positive for rash.  All other systems reviewed and are negative.    Physical Exam Triage Vital Signs ED Triage Vitals [02/08/23 1824]  Enc Vitals Group     BP      Pulse Rate 69     Resp 18     Temp 98 F (36.7 C)     Temp src      SpO2 96 %     Weight      Height      Head Circumference      Peak Flow      Pain Score      Pain Loc      Pain Edu?      Excl. in [place]?    No data found.  Updated Vital Signs BP 111/60   Pulse 69   Temp 98 F (36.7 C)   Resp 18   SpO2 96%   Visual Acuity Right Eye Distance:   Left Eye Distance:   Bilateral Distance:    Right Eye Near:   Left Eye Near:    Bilateral Near:     Physical Exam Vitals and nursing note reviewed.  Constitutional:      General: She is not in acute distress.    Appearance: Normal appearance. She is well-developed. She is not ill-appearing.  HENT:     Mouth/Throat:     Mouth: Mucous membranes are moist.     Pharynx: Oropharynx is clear.     Comments: No difficulty swallowing.  No oropharyngeal swelling.  Voice clear.  Cardiovascular:     Rate and Rhythm: Normal rate and regular rhythm.     Heart sounds: Normal heart sounds. No murmur heard. Pulmonary:     Effort: Pulmonary effort is normal. No respiratory distress.     Breath sounds: Normal breath sounds.     Comments: Good air movement.  Musculoskeletal:     Cervical back: Neck supple.  Skin:    General: Skin is warm and dry.     Findings: Rash present.     Comments: Few hives on arm and neck.  Neurological:     Mental Status: She is alert.  Psychiatric:        Mood and Affect: Mood normal.        Behavior:  Behavior normal.      UC Treatments / Results  Labs (all labs ordered are listed, but only abnormal results are displayed) Labs Reviewed - No data to display  EKG   Radiology No results found.  Procedures Procedures (including critical care time)  Medications Ordered in UC Medications - No data to display  Initial Impression / Assessment and Plan / UC Course  I have reviewed the triage vital signs and the nursing notes.  Pertinent labs & imaging results that were available during my care of the patient were reviewed by me and considered in my medical decision making (see chart for details).    Hives.  No difficulty swallowing or breathing.  Afebrile and vital signs are stable.  Treating with Zyrtec and prednisone.  Discussed with patient that she should stop taking the Macrobid as directed by her PCP; last taken yesterday evening.  Instructed patient to follow up with her PCP.  ED precautions given.  She agrees to plan of care.    Final Clinical Impressions(s) / UC Diagnoses   Final diagnoses:  Hives     Discharge Instructions      Stop the Macrobid as directed by your doctor.    Take the Zyrtec and prednisone as directed.    Follow up with your primary care provider.   Call 911 and go to the emergency department if you have difficulty swallowing or breathing.        ED Prescriptions     Medication Sig Dispense Auth. Provider   cetirizine (ZYRTEC ALLERGY) 10 MG tablet Take 1 tablet (10 mg total) by mouth daily. 14 tablet Sharion Balloon, NP   predniSONE (STERAPRED UNI-PAK 21 TAB) 10 MG (21) TBPK tablet Take by mouth daily. As directed 21 tablet Sharion Balloon, NP      PDMP not reviewed this encounter.   Sharion Balloon, NP 02/08/23 [id number]

## 2023-02-08 NOTE — Telephone Encounter (Signed)
Thanks Agree- stop macrobid and do not re start  Do not need to switch to another abx / was almost done anyway  Agree with ER precautions for any sob/wheeze   Aware, will watch for correspondence

## 2023-02-08 NOTE — Telephone Encounter (Signed)
Pt called to let [facility] know she believes she is having a allergic reaction to the meds, nitrofurantoin, macrocrystal-monohydrate, (MACROBID) 100 MG capsule. Pt states she has red whelps & itchiness on arms & face. Pt states her neck area, around throat, has itchiness too but no whelps. Transferring pt to access nurse. Call back # [phone]

## 2023-02-08 NOTE — Telephone Encounter (Signed)
I spoke with pt; pt said whelps on both arms, face and neck; no swelling in neck, throat, mouth or tongue. Pt has some pain left middle and ring finger that comes and goes.no difficulty in breathing and no SOB. Pt is in No distress. Pt is still itching. Pt said cannot take Benadryl due to [place] this morning. Pt last took macrobid last night and pt has taken 7 out of 10 macrobid. Pt said whelps and itching started yesterday. Advised pt not to take any more of macrobid and pt voiced understanding. And will add to med allergies and contraindications..pt said she is on her way home and will take a benadryl. Pt said if she needs to go anywhere pts husband will drive her due to Benadryl making pt sleepy.No available appts at [facility] or [facility]. Pt scheduled appt at [facility] 02/08/23 at 6:15 with UC & ED precautions and pt voiced understanding. Sending note to Dr Glori Bickers and [employer].

## 2023-02-08 NOTE — Telephone Encounter (Signed)
[place] Day - Client TELEPHONE ADVICE RECORD [facility] Patient Name: Elizabeth Duncan Gender: Female DOB: [birth year] Age: 72 Y 67 M 18 D Return Phone Number: [phone] (Primary) Address: City/ State/ Zip: [place]  [address] Client [place] [facility] - Client Client Site [place] Provider [facility], Roque Lias - MD Contact Type Call Who Is Calling Patient / Member / Family / Caregiver Call Type Triage / Clinical Relationship To Patient Self Return Phone Number [phone] (Primary) Chief Complaint Medication reaction Reason for Call Symptomatic / Request for Health Information Initial Comment Pt on Friday was prescribed a rx. She has welts and itchiness on her arms and face. Medication is macrocid '100mg'$ . Translation No Disp. Time [name] Time) Disposition Final User 02/08/2023 10:30:26 AM Attempt made - message left Toribio Harbour, RN, Joelene Millin 02/08/2023 10:40:48 AM Attempt made - message left Toribio Harbour, RN, Joelene Millin 02/08/2023 10:50:45 AM FINAL ATTEMPT MADE - message left Yes Toribio Harbour, RN, Joelene Millin Final Disposition 02/08/2023 10:50:45 AM FINAL ATTEMPT MADE - message left Yes Daves, RN, Kimberl

## 2023-02-08 NOTE — Discharge Instructions (Addendum)
Stop the Macrobid as directed by your doctor.    Take the Zyrtec and prednisone as directed.    Follow up with your primary care provider.   Call 911 and go to the emergency department if you have difficulty swallowing or breathing.

## 2023-02-08 NOTE — ED Triage Notes (Signed)
Patient to Urgent Care with complaints of allergic reaction. Reports whelps and itching present to arms and face. Rash started yesterday. Denies any SHOB.   Recently treated with macro-bid for a UTI on day 7 of 10.

## 2023-02-17 ENCOUNTER — Ambulatory Visit
Admission: RE | Admit: 2023-02-17 | Discharge: 2023-02-17 | Disposition: A | Discharge status: Home / Self Care | Payer: Medicare PPO | Source: Ambulatory Visit | Attending: Family Medicine | Admitting: Family Medicine

## 2023-02-17 DIAGNOSIS — E2839 Other primary ovarian failure: Secondary | ICD-10-CM

## 2023-02-17 DIAGNOSIS — Z780 Asymptomatic menopausal state: Secondary | ICD-10-CM | POA: Diagnosis not present

## 2023-05-15 ENCOUNTER — Telehealth: Payer: Self-pay | Admitting: Family Medicine

## 2023-05-15 NOTE — Telephone Encounter (Signed)
Called and spoke with patient, she would like to make an appointment to discuss her going out of the country to the [place] in October. She has been told she needs HEP A and HEP B vaccinations. Would like to discuss possibly getting titers for both of these as well. Scheduled patient appt for 05/24/23.

## 2023-05-15 NOTE — Telephone Encounter (Signed)
Patient contacted the office regarding vaccinations. States she is going out of the country and will be needing a vaccination for Hepatitis A and B, says she checked mychart and couldn't find record of this vaccination. Would like a call back to speak with someone about this if possible, please advise [phone].

## 2023-05-24 ENCOUNTER — Ambulatory Visit (INDEPENDENT_AMBULATORY_CARE_PROVIDER_SITE_OTHER): Payer: Medicare PPO | Admitting: Family Medicine

## 2023-05-24 ENCOUNTER — Encounter: Payer: Self-pay | Admitting: Family Medicine

## 2023-05-24 VITALS — BP 134/80 | HR 60 | Temp 98.0°F | Ht 65.25 in | Wt 146.2 lb

## 2023-05-24 DIAGNOSIS — Z7184 Encounter for health counseling related to travel: Secondary | ICD-10-CM | POA: Diagnosis not present

## 2023-05-24 DIAGNOSIS — Z23 Encounter for immunization: Secondary | ICD-10-CM | POA: Diagnosis not present

## 2023-05-24 NOTE — Progress Notes (Signed)
Subjective:    Patient ID: Elizabeth Duncan, female    DOB: 1951/07/04, 72 y.o.   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[place])  Will be gone for a year    Not leaving until the fall   Needs hepatitis A and B vaccine   Last Tdap 2018 -utd   Had MMR and polio   Utd pna vaccines  Had shingrix   Neg TB test 10/21/22  01/2023 - TB gold was negative     Patient Active Problem List   Diagnosis Date Noted   Encounter for counseling for travel 05/24/2023   Group B streptococcal UTI 02/03/2023   Screening-pulmonary TB 02/01/2023   Physical exam, pre-employment 02/01/2023   Abnormal urinalysis 02/01/2023   Encounter for screening mammogram for breast cancer 08/26/2022   Hyperlipidemia 08/18/2022   Cyst of left kidney 08/13/2021   Right flank pain 08/13/2021   Microscopic hematuria 08/13/2021   Medicare annual wellness visit, subsequent 11/04/2019   Estrogen deficiency 02/10/2017   Actinic keratoses 11/19/2013   Congenital vascular nevus 11/19/2013   Routine general medical examination at a health care facility 01/21/2013   Stress incontinence in female 01/21/2013   INTENTION TREMOR 07/17/2007   Past Medical History:  Diagnosis Date   Congenital vascular malformation    hemangiomas being treated with laser surgery by [facility] Staff (Dr. Azucena Cecil)   History of anemia    History of diverticulosis    History of pelvic mass    History of urinary incontinence    Intention tremor    Past Surgical History:  Procedure Laterality Date   bone spur  2008   Rt 5th toe removed   CARDIOVASCULAR STRESS TEST  2005   normal stress test per pt   COLONOSCOPY  04/17/2009    ESOPHAGOGASTRODUODENOSCOPY     gastritis   EXCISION PARTIAL PHALANX Left 03/16/2017   Procedure: Part excision bone phalanx of left 5th;  Surgeon: Recardo Evangelist, DPM;  Location: [facility];  Service: Podiatry;  Laterality: Left;  IVA local   laser treatment of birth mark     @ [facility] (right leg)   Social History   Tobacco Use   Smoking status: Never   Smokeless tobacco: Never  Vaping Use   Vaping Use: Never used  Substance Use Topics   Alcohol use: No    Alcohol/week: 0.0 standard drinks of alcohol   Drug use: No   Family History  Problem Relation Age of Onset   Alzheimer's disease Mother    Stroke Father        x2   Heart failure Father    Kidney Stones Brother    Diabetes Brother    Bipolar disorder Sister    Colon polyps Sister    Cancer Maternal Grandmother        breast cancer   Breast cancer Maternal Grandmother    Colon cancer Neg Hx    Esophageal cancer Neg Hx    Rectal cancer Neg Hx    Stomach cancer Neg Hx    Allergies  Allergen Reactions   Amoxicillin Rash  Macrobid [Nitrofurantoin] Rash    And itching.   Current Outpatient Medications on File Prior to Visit  Medication Sig Dispense Refill   cetirizine (ZYRTEC ALLERGY) 10 MG tablet Take 1 tablet (10 mg total) by mouth daily. 14 tablet 0   Multiple Vitamins-Minerals (CENTRUM SILVER 50+WOMEN PO) Take 1 Capful by mouth daily.     tretinoin (RETIN-A) 0.025 % cream SMARTSIG:sparingly Topical Every Night     No current facility-administered medications on file prior to visit.    Review of Systems  Constitutional:  Negative for activity change, appetite change, fatigue, fever and unexpected weight change.  HENT:  Negative for congestion, ear pain, rhinorrhea, sinus pressure and sore throat.   Eyes:  Negative for pain, redness and visual disturbance.  Respiratory:  Negative for cough, shortness of breath and wheezing.   Cardiovascular:  Negative for chest pain and palpitations.   Gastrointestinal:  Negative for abdominal pain, blood in stool, constipation and diarrhea.  Endocrine: Negative for polydipsia and polyuria.  Genitourinary:  Negative for dysuria, frequency and urgency.  Musculoskeletal:  Negative for arthralgias, back pain and myalgias.  Skin:  Negative for pallor and rash.  Allergic/Immunologic: Negative for environmental allergies.  Neurological:  Negative for dizziness, syncope and headaches.  Hematological:  Negative for adenopathy. Does not bruise/bleed easily.  Psychiatric/Behavioral:  Negative for decreased concentration and dysphoric mood. The patient is not nervous/anxious.        Objective:   Physical Exam Constitutional:      General: She is not in acute distress.    Appearance: Normal appearance. She is normal weight. She is not ill-appearing.  Cardiovascular:     Rate and Rhythm: Normal rate and regular rhythm.  Pulmonary:     Effort: Pulmonary effort is normal. No respiratory distress.  Musculoskeletal:     Right lower leg: No edema.     Left lower leg: No edema.  Skin:    Findings: No rash.  Neurological:     Mental Status: She is alert.  Psychiatric:        Mood and Affect: Mood normal.           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

## 2023-05-24 NOTE — Assessment & Plan Note (Signed)
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

## 2023-05-24 NOTE — Patient Instructions (Addendum)
We can start the Hep A and B vaccine   You need typhoid vaccine- this is at the health department  Call for an appt in the travel clinic   Let's make sure you get a flu shot and updated covid shot in the fall before you leave   Take care of yourself

## 2023-05-30 ENCOUNTER — Telehealth: Payer: Self-pay | Admitting: Family Medicine

## 2023-05-30 NOTE — Telephone Encounter (Signed)
Patient returned call ,I relayed message to her,she said that the pharmacy did look at the package insert and verified the same thing as to it being safe for her to receive.

## 2023-05-30 NOTE — Telephone Encounter (Signed)
I think that should be fine, but have her ask at the health dept also since they will have more info (the package insert) for the typhoid vaccine

## 2023-05-30 NOTE — Telephone Encounter (Signed)
Patient would like to know if its okay for her to receive typhoid vaccine, since she go the twinix last week on 05/24/2023?

## 2023-05-30 NOTE — Telephone Encounter (Signed)
Left message to return call to our office.  

## 2023-06-01 ENCOUNTER — Ambulatory Visit (INDEPENDENT_AMBULATORY_CARE_PROVIDER_SITE_OTHER): Payer: Medicare PPO

## 2023-06-01 VITALS — Ht 65.5 in | Wt 141.0 lb

## 2023-06-01 DIAGNOSIS — Z0000 Encounter for general adult medical examination without abnormal findings: Secondary | ICD-10-CM

## 2023-06-01 NOTE — Progress Notes (Signed)
Subjective:   Elizabeth Duncan is a 72 y.o. female who presents for Medicare Annual (Subsequent) preventive examination.  Visit Complete: Virtual  I connected with  Elizabeth Duncan on 06/01/23 by a audio enabled telemedicine application and verified that I am speaking with the correct person using two identifiers.  Patient Location: Home  Provider Location: Home Office  I discussed the limitations of evaluation and management by telemedicine. The patient expressed understanding and agreed to proceed.  Review of Systems      Cardiac Risk Factors include: advanced age (>79men, >57 women);dyslipidemia     Objective:    Today's Vitals   06/01/23 1420  Weight: 141 lb (64 kg)  Height: 5' 5.5" (1.664 m)   Body mass index is 23.11 kg/m.     06/01/2023    2:27 PM 02/08/2023    6:34 PM 05/30/2022    2:49 PM 05/27/2021    2:52 PM 07/12/2017   11:03 AM 03/16/2017    6:45 AM  Advanced Directives  Does Patient Have a Medical Advance Directive? Yes No Yes Yes Yes Yes  Type of Estate agent of [place];Living will  Healthcare Power of [place];Living will Healthcare Power of [place];Living will Healthcare Power of Attorney Living will;Healthcare Power of Attorney  Does patient want to make changes to medical advance directive?     No - Patient declined No - Patient declined  Copy of Healthcare Power of Attorney in Chart? No - copy requested  No - copy requested No - copy requested No - copy requested No - copy requested    Current Medications (verified) Outpatient Encounter Medications as of 06/01/2023  Medication Sig   Multiple Vitamins-Minerals (CENTRUM SILVER 50+WOMEN PO) Take 1 Capful by mouth daily.   tretinoin (RETIN-A) 0.025 % cream SMARTSIG:sparingly Topical Every Night   cetirizine (ZYRTEC ALLERGY) 10 MG tablet Take 1 tablet (10 mg total) by mouth daily. (Patient not taking: Reported on 06/01/2023)   No facility-administered encounter medications  on file as of 06/01/2023.    Allergies (verified) Nitrofurantoin monohyd macro, Amoxicillin, and Macrobid [nitrofurantoin]   History: Past Medical History:  Diagnosis Date   Congenital vascular malformation    hemangiomas being treated with laser surgery by [facility] Staff (Dr. Azucena Cecil)   History of anemia    History of diverticulosis    History of pelvic mass    History of urinary incontinence    Intention tremor    Past Surgical History:  Procedure Laterality Date   bone spur  2008   Rt 5th toe removed   CARDIOVASCULAR STRESS TEST  2005   normal stress test per pt   COLONOSCOPY  04/17/2009   ESOPHAGOGASTRODUODENOSCOPY     gastritis   EXCISION PARTIAL PHALANX Left 03/16/2017   Procedure: Part excision bone phalanx of left 5th;  Surgeon: Recardo Evangelist, DPM;  Location: [facility];  Service: Podiatry;  Laterality: Left;  IVA local   laser treatment of birth mark     @ [facility] (right leg)   Family History  Problem Relation Age of Onset   Alzheimer's disease Mother    Stroke Father        x2   Heart failure Father    Kidney Stones Brother    Diabetes Brother    Bipolar disorder Sister    Colon polyps Sister    Cancer Maternal Grandmother        breast cancer   Breast cancer Maternal Grandmother    Colon cancer Neg  Hx    Esophageal cancer Neg Hx    Rectal cancer Neg Hx    Stomach cancer Neg Hx    Social History   Socioeconomic History   Marital status: Married    Spouse name: Not on file   Number of children: Not on file   Years of education: Not on file   Highest education level: Not on file  Occupational History   Not on file  Tobacco Use   Smoking status: Never   Smokeless tobacco: Never  Vaping Use   Vaping Use: Never used  Substance and Sexual Activity   Alcohol use: No    Alcohol/week: 0.0 standard drinks of alcohol   Drug use: No   Sexual activity: Not on file  Other Topics Concern   Not on file  Social History Narrative   Not on file   Social  Determinants of Health   Financial Resource Strain: Low Risk  (06/01/2023)   Overall Financial Resource Strain (CARDIA)    Difficulty of Paying Living Expenses: Not hard at all  Food Insecurity: No Food Insecurity (06/01/2023)   Hunger Vital Sign    Worried About Running Out of Food in the Last Year: Never true    Ran Out of Food in the Last Year: Never true  Transportation Needs: No Transportation Needs (06/01/2023)   PRAPARE - Administrator, Civil Service (Medical): No    Lack of Transportation (Non-Medical): No  Physical Activity: Sufficiently Active (06/01/2023)   Exercise Vital Sign    Days of Exercise per Week: 6 days    Minutes of Exercise per Session: 30 min  Stress: No Stress Concern Present (06/01/2023)   [employer] of Occupational Health - Occupational Stress Questionnaire    Feeling of Stress : Not at all  Social Connections: Socially Integrated (06/01/2023)   Social Connection and Isolation Panel [NHANES]    Frequency of Communication with Friends and Family: More than three times a week    Frequency of Social Gatherings with Friends and Family: More than three times a week    Attends Religious Services: More than 4 times per year    Active Member of [employer] or Organizations: Yes    Attends Engineer, structural: More than 4 times per year    Marital Status: Married    Tobacco Counseling Counseling given: Not Answered   Clinical Intake:  Pre-visit preparation completed: Yes  Pain : No/denies pain     Nutritional Risks: None Diabetes: No  How often do you need to have someone help you when you read instructions, pamphlets, or other written materials from your doctor or pharmacy?: 1 - Never  Interpreter Needed?: No  Information entered by :: C.Zanasia Hickson LPN   Activities of Daily Living    06/01/2023    2:30 PM  In your present state of health, do you have any difficulty performing the following activities:  Hearing? 0  Vision? 0   Difficulty concentrating or making decisions? 0  Walking or climbing stairs? 0  Dressing or bathing? 0  Doing errands, shopping? 0  Preparing Food and eating ? N  Using the Toilet? N  In the past six months, have you accidently leaked urine? Y  Comment occasional, wears pad, has improved a bit.  Do you have problems with loss of bowel control? N  Managing your Medications? N  Managing your Finances? N  Housekeeping or managing your Housekeeping? N    Patient Care Team: [place], Audrie Gallus,  MD as PCP - General (Family Medicine)  Indicate any recent Medical Services you may have received from other than [facility] providers in the past year (date may be approximate).     Assessment:   This is a routine wellness examination for [place].  Hearing/Vision screen Hearing Screening - Comments:: No hearing issues Vision Screening - Comments:: Glasses - UTD on exams - [facility]  Dietary issues and exercise activities discussed:     Goals Addressed             This Visit's Progress    Patient Stated       Lost 6-7lbs       Depression Screen    06/01/2023    2:26 PM 05/24/2023    4:07 PM 02/01/2023    3:50 PM 05/30/2022    2:46 PM 05/27/2021    2:53 PM 11/04/2019   10:15 AM 10/30/2018    8:37 AM  PHQ 2/9 Scores  PHQ - 2 Score 0 0 0 0 0 0 0  PHQ- 9 Score 0 0 0  0      Fall Risk    06/01/2023    2:30 PM 05/24/2023    4:07 PM 02/01/2023    3:50 PM 05/30/2022    2:50 PM 05/27/2021    2:53 PM  Fall Risk   Falls in the past year? 0 0 0 1 0  Number falls in past yr: 0 0 0 0 0  Injury with Fall? 0 0 0 1 0  Risk for fall due to : No Fall Risks No Fall Risks No Fall Risks History of fall(s);Impaired balance/gait No Fall Risks  Follow up Falls prevention discussed;Falls evaluation completed Falls evaluation completed Falls evaluation completed Falls prevention discussed Falls evaluation completed;Falls prevention discussed    MEDICARE RISK AT HOME:  Medicare Risk at Home -  06/01/23 1432     Any stairs in or around the home? Yes    If so, are there any without handrails? No    Home free of loose throw rugs in walkways, pet beds, electrical cords, etc? Yes    Adequate lighting in your home to reduce risk of falls? Yes    Life alert? No    Use of a cane, walker or w/c? No    Grab bars in the bathroom? No    Shower chair or bench in shower? No    Elevated toilet seat or a handicapped toilet? No             Cognitive Function:    05/27/2021    2:59 PM  MMSE - Mini Mental State Exam  Not completed: Refused        06/01/2023    2:33 PM 05/30/2022    2:53 PM  6CIT Screen  What Year? 0 points 0 points  What month? 0 points 0 points  What time? 0 points 0 points  Count back from 20 0 points 0 points  Months in reverse 0 points 0 points  Repeat phrase 0 points 0 points  Total Score 0 points 0 points    Immunizations Immunization History  Administered Date(s) Administered   Fluad Quad(high Dose 65+) 08/07/2019, 10/08/2020, 08/26/2022   Hep A / Hep B 05/24/2023   Influenza Whole 10/12/2001, 09/16/2010   Influenza, High Dose Seasonal PF 08/18/2017   Influenza,inj,Quad PF,6+ Mos 08/26/2014, 08/18/2016, 10/30/2018   Influenza-Unspecified 10/13/2015   PFIZER(Purple Top)SARS-COV-2 Vaccination 01/23/2020, 02/18/2020   PPD Test 02/10/2017, 10/19/2022   Pneumococcal  Conjugate-13 02/10/2017   Pneumococcal Polysaccharide-23 10/30/2018   Td 12/13/1993, 07/17/2007   Tdap 02/10/2017   Zoster Recombinat (Shingrix) 06/03/2021, 09/03/2021   Zoster, Live 11/13/2015    TDAP status: Up to date  Flu Vaccine status: Up to date  Pneumococcal vaccine status: Up to date  Covid-19 vaccine status: Information provided on how to obtain vaccines.   Qualifies for Shingles Vaccine? Yes   Zostavax completed No   Shingrix Completed?: Yes  Screening Tests Health Maintenance  Topic Date Due   COVID-19 Vaccine (3 - Pfizer risk series) 03/17/2020   Hepatitis C  Screening  05/23/2024 (Originally 12/21/1968)   INFLUENZA VACCINE  07/13/2023   MAMMOGRAM  11/15/2023   Medicare Annual Wellness (AWV)  05/31/2024   Colonoscopy  09/17/2026   DTaP/Tdap/Td (4 - Td or Tdap) 02/11/2027   Pneumonia Vaccine 56+ Years old  Completed   DEXA SCAN  Completed   Zoster Vaccines- Shingrix  Completed   HPV VACCINES  Aged Out    Health Maintenance  Health Maintenance Due  Topic Date Due   COVID-19 Vaccine (3 - Pfizer risk series) 03/17/2020    Colorectal cancer screening: Type of screening: Colonoscopy. Completed 09/18/19. Repeat every 7 years  Mammogram status: Completed 11/14/22. Repeat every year Declined annual mammogram.  Bone Density status: Completed 02/17/23. Results reflect: Bone density results: NORMAL. Repeat every 5 years.  Lung Cancer Screening: (Low Dose CT Chest recommended if Age 19-80 years, 20 pack-year currently smoking OR have quit w/in 15years.) does not qualify.   Lung Cancer Screening Referral: no  Additional Screening:  Hepatitis C Screening: does qualify; Completed DUE AT NEXT VISIT  Vision Screening: Recommended annual ophthalmology exams for early detection of glaucoma and other disorders of the eye. Is the patient up to date with their annual eye exam?  Yes  Who is the provider or what is the name of the office in which the patient attends annual eye exams? [facility] If pt is not established with a provider, would they like to be referred to a provider to establish care? Yes .   Dental Screening: Recommended annual dental exams for proper oral hygiene    Community Resource Referral / Chronic Care Management: CRR required this visit?  No   CCM required this visit?  No     Plan:     I have personally reviewed and noted the following in the patient's chart:   Medical and social history Use of alcohol, tobacco or illicit drugs  Current medications and supplements including opioid prescriptions. Patient is not  currently taking opioid prescriptions. Functional ability and status Nutritional status Physical activity Advanced directives List of other physicians Hospitalizations, surgeries, and ER visits in previous 12 months Vitals Screenings to include cognitive, depression, and falls Referrals and appointments  In addition, I have reviewed and discussed with patient certain preventive protocols, quality metrics, and best practice recommendations. A written personalized care plan for preventive services as well as general preventive health recommendations were provided to patient.     Maryan Puls, LPN   1/61/0960   After Visit Summary: (MyChart) Due to this being a telephonic visit, the after visit summary with patients personalized plan was offered to patient via MyChart   Nurse Notes: Pt declined mammogram, stated she will be out of the country for a year and will call to schedule when she returns.

## 2023-06-01 NOTE — Patient Instructions (Addendum)
Elizabeth Duncan , Thank you for taking time to come for your Medicare Wellness Visit. I appreciate your ongoing commitment to your health goals. Please review the following plan we discussed and let me know if I can assist you in the future.   These are the goals we discussed:  Goals      Patient Stated     05/27/2021, I will continue aerobics and weight lifting 5-6 days for 35 minutes.      Patient Stated     Lost 6-7lbs        This is a list of the screening recommended for you and due dates:  Health Maintenance  Topic Date Due   COVID-19 Vaccine (3 - Pfizer risk series) 03/17/2020   Hepatitis C Screening  05/23/2024*   Flu Shot  07/13/2023   Mammogram  11/15/2023   Medicare Annual Wellness Visit  05/31/2024   Colon Cancer Screening  09/17/2026   DTaP/Tdap/Td vaccine (4 - Td or Tdap) 02/11/2027   Pneumonia Vaccine  Completed   DEXA scan (bone density measurement)  Completed   Zoster (Shingles) Vaccine  Completed   HPV Vaccine  Aged Out  *Topic was postponed. The date shown is not the original due date.    Advanced directives: Please bring a copy of your health care power of attorney and living will to the office to be added to your chart at your convenience.   Conditions/risks identified: Aim for 30 minutes of exercise or brisk walking, 6-8 glasses of water, and 5 servings of fruits and vegetables each day.   Next appointment: Follow up in one year for your annual wellness visit :   [facility] @ [phone] TO SCHEDULE ANNUAL MEDICARE WELLNESS VISIT WHEN YOU RETURN TO THE COUNTRY.   Preventive Care 61 Years and Older, Female Preventive care refers to lifestyle choices and visits with your health care provider that can promote health and wellness. What does preventive care include? A yearly physical exam. This is also called an annual well check. Dental exams once or twice a year. Routine eye exams. Ask your health care provider how often you should have your eyes  checked. Personal lifestyle choices, including: Daily care of your teeth and gums. Regular physical activity. Eating a healthy diet. Avoiding tobacco and drug use. Limiting alcohol use. Practicing safe sex. Taking low-dose aspirin every day. Taking vitamin and mineral supplements as recommended by your health care provider. What happens during an annual well check? The services and screenings done by your health care provider during your annual well check will depend on your age, overall health, lifestyle risk factors, and family history of disease. Counseling  Your health care provider may ask you questions about your: Alcohol use. Tobacco use. Drug use. Emotional well-being. Home and relationship well-being. Sexual activity. Eating habits. History of falls. Memory and ability to understand (cognition). Work and work Astronomer. Reproductive health. Screening  You may have the following tests or measurements: Height, weight, and BMI. Blood pressure. Lipid and cholesterol levels. These may be checked every 5 years, or more frequently if you are over 31 years old. Skin check. Lung cancer screening. You may have this screening every year starting at age 48 if you have a 30-pack-year history of smoking and currently smoke or have quit within the past 15 years. Fecal occult blood test (FOBT) of the stool. You may have this test every year starting at age 53. Flexible sigmoidoscopy or colonoscopy. You may have a sigmoidoscopy every 5  years or a colonoscopy every 10 years starting at age 19. Hepatitis C blood test. Hepatitis B blood test. Sexually transmitted disease (STD) testing. Diabetes screening. This is done by checking your blood sugar (glucose) after you have not eaten for a while (fasting). You may have this done every 1-3 years. Bone density scan. This is done to screen for osteoporosis. You may have this done starting at age 77. Mammogram. This may be done every 1-2  years. Talk to your health care provider about how often you should have regular mammograms. Talk with your health care provider about your test results, treatment options, and if necessary, the need for more tests. Vaccines  Your health care provider may recommend certain vaccines, such as: Influenza vaccine. This is recommended every year. Tetanus, diphtheria, and acellular pertussis (Tdap, Td) vaccine. You may need a Td booster every 10 years. Zoster vaccine. You may need this after age 63. Pneumococcal 13-valent conjugate (PCV13) vaccine. One dose is recommended after age 51. Pneumococcal polysaccharide (PPSV23) vaccine. One dose is recommended after age 16. Talk to your health care provider about which screenings and vaccines you need and how often you need them. This information is not intended to replace advice given to you by your health care provider. Make sure you discuss any questions you have with your health care provider. Document Released: 12/25/2015 Document Revised: 08/17/2016 Document Reviewed: 09/29/2015 Elsevier Interactive Patient Education  2017 [place] Prevention in the Home Falls can cause injuries. They can happen to people of all ages. There are many things you can do to make your home safe and to help prevent falls. What can I do on the outside of my home? Regularly fix the edges of walkways and driveways and fix any cracks. Remove anything that might make you trip as you walk through a door, such as a raised step or threshold. Trim any bushes or trees on the path to your home. Use bright outdoor lighting. Clear any walking paths of anything that might make someone trip, such as rocks or tools. Regularly check to see if handrails are loose or broken. Make sure that both sides of any steps have handrails. Any raised decks and porches should have guardrails on the edges. Have any leaves, snow, or ice cleared regularly. Use sand or salt on walking paths  during winter. Clean up any spills in your garage right away. This includes oil or grease spills. What can I do in the bathroom? Use night lights. Install grab bars by the toilet and in the tub and shower. Do not use towel bars as grab bars. Use non-skid mats or decals in the tub or shower. If you need to sit down in the shower, use a plastic, non-slip stool. Keep the floor dry. Clean up any water that spills on the floor as soon as it happens. Remove soap buildup in the tub or shower regularly. Attach bath mats securely with double-sided non-slip rug tape. Do not have throw rugs and other things on the floor that can make you trip. What can I do in the bedroom? Use night lights. Make sure that you have a light by your bed that is easy to reach. Do not use any sheets or blankets that are too big for your bed. They should not hang down onto the floor. Have a firm chair that has side arms. You can use this for support while you get dressed. Do not have throw rugs and other things on the  floor that can make you trip. What can I do in the kitchen? Clean up any spills right away. Avoid walking on wet floors. Keep items that you use a lot in easy-to-reach places. If you need to reach something above you, use a strong step stool that has a grab bar. Keep electrical cords out of the way. Do not use floor polish or wax that makes floors slippery. If you must use wax, use non-skid floor wax. Do not have throw rugs and other things on the floor that can make you trip. What can I do with my stairs? Do not leave any items on the stairs. Make sure that there are handrails on both sides of the stairs and use them. Fix handrails that are broken or loose. Make sure that handrails are as long as the stairways. Check any carpeting to make sure that it is firmly attached to the stairs. Fix any carpet that is loose or worn. Avoid having throw rugs at the top or bottom of the stairs. If you do have throw  rugs, attach them to the floor with carpet tape. Make sure that you have a light switch at the top of the stairs and the bottom of the stairs. If you do not have them, ask someone to add them for you. What else can I do to help prevent falls? Wear shoes that: Do not have high heels. Have rubber bottoms. Are comfortable and fit you well. Are closed at the toe. Do not wear sandals. If you use a stepladder: Make sure that it is fully opened. Do not climb a closed stepladder. Make sure that both sides of the stepladder are locked into place. Ask someone to hold it for you, if possible. Clearly mark and make sure that you can see: Any grab bars or handrails. First and last steps. Where the edge of each step is. Use tools that help you move around (mobility aids) if they are needed. These include: Canes. Walkers. Scooters. Crutches. Turn on the lights when you go into a dark area. Replace any light bulbs as soon as they burn out. Set up your furniture so you have a clear path. Avoid moving your furniture around. If any of your floors are uneven, fix them. If there are any pets around you, be aware of where they are. Review your medicines with your doctor. Some medicines can make you feel dizzy. This can increase your chance of falling. Ask your doctor what other things that you can do to help prevent falls. This information is not intended to replace advice given to you by your health care provider. Make sure you discuss any questions you have with your health care provider. Document Released: 09/24/2009 Document Revised: 05/05/2016 Document Reviewed: 01/02/2015 Elsevier Interactive Patient Education  2017 [employer].

## 2023-06-08 DIAGNOSIS — L578 Other skin changes due to chronic exposure to nonionizing radiation: Secondary | ICD-10-CM | POA: Diagnosis not present

## 2023-06-08 DIAGNOSIS — M71342 Other bursal cyst, left hand: Secondary | ICD-10-CM | POA: Diagnosis not present

## 2023-06-08 DIAGNOSIS — D225 Melanocytic nevi of trunk: Secondary | ICD-10-CM | POA: Diagnosis not present

## 2023-06-08 DIAGNOSIS — L738 Other specified follicular disorders: Secondary | ICD-10-CM | POA: Diagnosis not present

## 2023-06-08 DIAGNOSIS — D2262 Melanocytic nevi of left upper limb, including shoulder: Secondary | ICD-10-CM | POA: Diagnosis not present

## 2023-06-08 DIAGNOSIS — D2261 Melanocytic nevi of right upper limb, including shoulder: Secondary | ICD-10-CM | POA: Diagnosis not present

## 2023-06-08 DIAGNOSIS — I8393 Asymptomatic varicose veins of bilateral lower extremities: Secondary | ICD-10-CM | POA: Diagnosis not present

## 2023-06-27 ENCOUNTER — Telehealth: Payer: Self-pay

## 2023-06-27 ENCOUNTER — Ambulatory Visit: Payer: Medicare PPO

## 2023-06-27 NOTE — Telephone Encounter (Signed)
Pt has an appointment for a nurse visit for Hep A and B.  Medicare will not cover these vaccines given in the office.  She will need to go to a pharmacy to receive these vaccines.

## 2023-06-27 NOTE — Telephone Encounter (Signed)
Patient called in returning call. Relayed message below.

## 2023-08-21 ENCOUNTER — Telehealth: Payer: Self-pay | Admitting: Family Medicine

## 2023-08-21 NOTE — Telephone Encounter (Signed)
Called to check on her she said she couldn't get in touch  with anyone at [facility] she could only leave a message and someone will call her back in 48-72 hr. Pt said after she couldn't get in touch with anyone there she decided to take a nap. When she woke up she was feeling a lot better but still has slight "fuzzy feeling" and some dizziness, but the nausea and HA has improved.   Spoke with PCP who advise to have pt hydrate today and get some rest and see how she feels in the morning. If pt still has sxs I advise her to call and schedule an appt with PCP. Pt verbalized understanding.  ER/ UC precautions also given to pt  FYI to PCP

## 2023-08-21 NOTE — Telephone Encounter (Signed)
Please check in and see how she is  Thanks

## 2023-08-21 NOTE — Telephone Encounter (Signed)
FYI: This call has been transferred to Access Nurse. Once the result note has been entered staff can address the message at that time.  Patient called in with the following symptoms:  Red Word:dizziness  Patient husband called in and stated that patient is experiencing some dizziness, vomiting and diarrhea.   Please advise at [facility] [phone]  Message is routed to Provider Pool and [facility] Triage

## 2023-08-21 NOTE — Telephone Encounter (Signed)
I spoke with pts husband; pt was on other line with [facility] in [place] about a japanese encephalitis vaccine she received on 08/18/23 to see if that might cause pts symptoms. Pts husband said if [facility] cannot help pt pt may go to [facility]. Sending note to Dr Milinda Antis and [employer].

## 2023-08-23 NOTE — Telephone Encounter (Signed)
Pt called stating the clinic she received the 1st dosage of the japanese encephalitis vaccine from, instructed her to get Dr. Royden Purl approval on receiving the 2nd dosage on the vaccine? Call back # [phone]

## 2023-08-23 NOTE — Telephone Encounter (Signed)
I really cannot give the ok on that until we talk about the symptoms she had first  Can she come in or set up a virtual visit?

## 2023-08-24 NOTE — Telephone Encounter (Signed)
Called patient reviewed information and set up for virtual next week.

## 2023-08-29 ENCOUNTER — Telehealth (INDEPENDENT_AMBULATORY_CARE_PROVIDER_SITE_OTHER): Payer: Medicare PPO | Admitting: Family Medicine

## 2023-08-29 ENCOUNTER — Encounter: Payer: Self-pay | Admitting: Family Medicine

## 2023-08-29 VITALS — BP 119/59 | HR 65 | Temp 98.0°F | Wt 147.0 lb

## 2023-08-29 DIAGNOSIS — Z7184 Encounter for health counseling related to travel: Secondary | ICD-10-CM | POA: Diagnosis not present

## 2023-08-29 NOTE — Assessment & Plan Note (Signed)
Getting ready to leave for [place] for a year /mission trip  Will have to get 3rd Hep B there (had first 2) Hep A utd Will get flu and covid vaccines at pharmacy  2nd Japanese encephalitis vaccine (travel clinic) will be 5 days pre travel May have had side effects to first but no allergic symptoms  Tb gold negative Pna vaccines and tdap utd   Discussed being careful with food and drink  Using insect repellent

## 2023-08-29 NOTE — Patient Instructions (Addendum)
Get your covid and flu shots at a pharmacy   Be careful Stay hydrated  Stay safe   Get your Japanese encephalitis shot as planned  Keep [place] posted re: any side effects

## 2023-08-29 NOTE — Progress Notes (Signed)
Virtual Visit via Video Note  I connected with Elizabeth Duncan on 08/29/23 at 10:30 AM EDT by a video enabled telemedicine application and verified that I am speaking with the correct person using two identifiers.  Location: Patient: home Provider: office    I discussed the limitations of evaluation and management by telemedicine and the availability of in person appointments. The patient expressed understanding and agreed to proceed.  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/6   (known to be prevalent in area she goes)  Hospital doctor to [facility]   Dizziness/ nausea / diarrhea and vomiting and some sweating  Vertigo type of dizziness Then back spasm  Lasted about 2 hours (3 days after vaccine) No shortness of breath No throat swelling  No wheeze No hives or rash   Next dose is planned 5 d before leaving    Had shingrix vaccines   Flu shot -plans to get soon   Tdap was 02/2017  Pna vaccine 2019 (had both pna 23 and prevnar 13)    TB gold negative 01/2023   Covid imm -has option to get this at pharmacy   Has some clinics in the town she will be in  [facility] will be [place] minutes away     Patient Active Problem List   Diagnosis Date Noted   Encounter for counseling for travel 05/24/2023   Group B streptococcal UTI 02/03/2023   Screening-pulmonary TB 02/01/2023   Physical exam, pre-employment 02/01/2023   Abnormal urinalysis 02/01/2023   Encounter for screening mammogram for breast cancer 08/26/2022   Hyperlipidemia 08/18/2022   Cyst of left kidney 08/13/2021   Right flank  pain 08/13/2021   Microscopic hematuria 08/13/2021   Medicare annual wellness visit, subsequent 11/04/2019   Estrogen deficiency 02/10/2017   Actinic keratoses 11/19/2013   Congenital vascular nevus 11/19/2013   Routine general medical examination at a health care facility 01/21/2013   Stress incontinence in female 01/21/2013   INTENTION TREMOR 07/17/2007   Past Medical History:  Diagnosis Date   Congenital vascular malformation    hemangiomas being treated with laser surgery by [facility] (Dr. Azucena Cecil)   History of anemia    History of diverticulosis    History of pelvic mass    History of urinary incontinence    Intention tremor    Past Surgical History:  Procedure Laterality Date   bone spur  2008   Rt 5th toe removed   CARDIOVASCULAR STRESS TEST  2005   normal stress test per pt   COLONOSCOPY  04/17/2009   ESOPHAGOGASTRODUODENOSCOPY     gastritis   EXCISION PARTIAL PHALANX Left 03/16/2017   Procedure: Part excision bone phalanx of left 5th;  Surgeon: Recardo Evangelist, DPM;  Location: [facility];  Service: Podiatry;  Laterality: Left;  IVA local   laser treatment of birth mark     @ [facility] (right leg)   Social History   Tobacco Use   Smoking status: Never   Smokeless tobacco: Never  Vaping Use   Vaping status: Never  Used  Substance Use Topics   Alcohol use: No    Alcohol/week: 0.0 standard drinks of alcohol   Drug use: No   Family History  Problem Relation Age of Onset   Alzheimer's disease Mother    Stroke Father        x2   Heart failure Father    Kidney Stones Brother    Diabetes Brother    Bipolar disorder Sister    Colon polyps Sister    Cancer Maternal Grandmother        breast cancer   Breast cancer Maternal Grandmother    Colon cancer Neg Hx    Esophageal cancer Neg Hx    Rectal cancer Neg Hx    Stomach cancer Neg Hx    Allergies  Allergen Reactions   Nitrofurantoin Monohyd Macro Hives   Amoxicillin Rash        Macrobid  [Nitrofurantoin] Rash    And itching.   Current Outpatient Medications on File Prior to Visit  Medication Sig Dispense Refill   Multiple Vitamins-Minerals (CENTRUM SILVER 50+WOMEN PO) Take 1 Capful by mouth daily.     tretinoin (RETIN-A) 0.025 % cream SMARTSIG:sparingly Topical Every Night     No current facility-administered medications on file prior to visit.   Review of Systems  Constitutional:  Negative for chills, fever and malaise/fatigue.  HENT:  Negative for congestion, ear pain, sinus pain and sore throat.   Eyes:  Negative for blurred vision, discharge and redness.  Respiratory:  Negative for cough, shortness of breath and stridor.   Cardiovascular:  Negative for chest pain, palpitations and leg swelling.  Gastrointestinal:  Negative for abdominal pain, diarrhea, nausea and vomiting.  Musculoskeletal:  Negative for myalgias.  Skin:  Negative for rash.  Neurological:  Negative for dizziness and headaches.     Observations/Objective: Patient appears well, in no distress Weight is baseline  No facial swelling or asymmetry Normal voice-not hoarse and no slurred speech No obvious tremor or mobility impairment Moving neck and UEs normally Able to hear the call well  No cough or shortness of breath during interview  Talkative and mentally sharp with no cognitive changes No skin changes on face or neck , no rash or pallor Affect is normal    Assessment and Plan: Problem List Items Addressed This Visit       Other   Encounter for counseling for travel - Primary    Getting ready to leave for [place] for a year /mission trip  Will have to get 3rd Hep B there (had first 2) Hep A utd Will get flu and covid vaccines at pharmacy  2nd Japanese encephalitis vaccine (travel clinic) will be 5 days pre travel May have had side effects to first but no allergic symptoms  Tb gold negative Pna vaccines and tdap utd   Discussed being careful with food and drink  Using insect  repellent         Follow Up Instructions: Get your covid and flu shots at a pharmacy   Be careful Stay hydrated  Stay safe   Get your Japanese encephalitis shot as planned  Keep [place] posted re: any side effects   I discussed the assessment and treatment plan with the patient. The patient was provided an opportunity to ask questions and all were answered. The patient agreed with the plan and demonstrated an understanding of the instructions.   The patient was advised to call back or seek an in-person evaluation if the symptoms worsen  or if the condition fails to improve as anticipated.     Roxy Manns, MD

## 2024-07-02 ENCOUNTER — Other Ambulatory Visit: Payer: Self-pay | Admitting: Family Medicine

## 2024-07-02 DIAGNOSIS — Z1231 Encounter for screening mammogram for malignant neoplasm of breast: Secondary | ICD-10-CM

## 2024-10-21 DIAGNOSIS — N393 Stress incontinence (female) (male): Secondary | ICD-10-CM | POA: Diagnosis not present

## 2024-10-21 DIAGNOSIS — N8111 Cystocele, midline: Secondary | ICD-10-CM | POA: Diagnosis not present

## 2024-10-28 ENCOUNTER — Ambulatory Visit
Admission: RE | Admit: 2024-10-28 | Discharge: 2024-10-28 | Disposition: A | Discharge status: Home / Self Care | Source: Ambulatory Visit | Attending: Family Medicine | Admitting: Family Medicine

## 2024-10-28 DIAGNOSIS — Z1231 Encounter for screening mammogram for malignant neoplasm of breast: Secondary | ICD-10-CM

## 2024-10-29 ENCOUNTER — Ambulatory Visit: Payer: Self-pay | Admitting: Family Medicine

## 2024-10-29 ENCOUNTER — Ambulatory Visit

## 2024-11-05 DIAGNOSIS — D2272 Melanocytic nevi of left lower limb, including hip: Secondary | ICD-10-CM | POA: Diagnosis not present

## 2024-11-05 DIAGNOSIS — D2271 Melanocytic nevi of right lower limb, including hip: Secondary | ICD-10-CM | POA: Diagnosis not present

## 2024-11-05 DIAGNOSIS — D2262 Melanocytic nevi of left upper limb, including shoulder: Secondary | ICD-10-CM | POA: Diagnosis not present

## 2024-11-05 DIAGNOSIS — D225 Melanocytic nevi of trunk: Secondary | ICD-10-CM | POA: Diagnosis not present

## 2024-11-05 DIAGNOSIS — D2261 Melanocytic nevi of right upper limb, including shoulder: Secondary | ICD-10-CM | POA: Diagnosis not present

## 2024-11-05 DIAGNOSIS — L821 Other seborrheic keratosis: Secondary | ICD-10-CM | POA: Diagnosis not present

## 2025-01-22 ENCOUNTER — Ambulatory Visit
